# Patient Record
Sex: Female | Born: 1975 | Race: White | Hispanic: No | Marital: Married | Smoking: Never smoker
Health system: Southern US, Community
[De-identification: ages and names within clinical notes are randomized; demographics above are authoritative.]

## PROBLEM LIST (undated history)

## (undated) DIAGNOSIS — Z789 Other specified health status: Secondary | ICD-10-CM

## (undated) DIAGNOSIS — C801 Malignant (primary) neoplasm, unspecified: Secondary | ICD-10-CM

## (undated) DIAGNOSIS — R87629 Unspecified abnormal cytological findings in specimens from vagina: Secondary | ICD-10-CM

## (undated) HISTORY — PX: WISDOM TOOTH EXTRACTION: SHX21

---

## 2010-01-13 HISTORY — PX: LEEP: SHX91

## 2010-02-27 NOTE — L&D Delivery Note (Addendum)
Delivery Note  Stand-by for delivery for Dr. Juliene Pina, patient pushing effectively with minimal coaching. At 12:35 PM a viable female was delivered via Vaginal, Spontaneous Delivery (Presentation: Left Occiput Anterior).  APGAR: 9, 9; weight 6 lb 0.7 oz (2741 g).   Placenta status: Intact, Spontaneous, to patho - marginal insertion, PTB. Cord: 3 vessels with the following complications: none.  Cord pH: n/a  Anesthesia: Epidural  Episiotomy: None Lacerations: 2nd degree;Perineal (by Dr. Juliene Pina) Suture Repair: 3.0 vicryl rapide Est. Blood Loss (mL): 400cc  Mom to postpartum.  Baby  with mother.  Amber Terrell,Amber Terrell 10/21/2010, 1:25 PM  MD note- Arrived after SVD and placenta delivery which were both uncomplicated. 2nd degree perineal lac repaired. Pt baby stable.

## 2010-04-07 LAB — RUBELLA ANTIBODY, IGM: Rubella: IMMUNE

## 2010-04-07 LAB — HEPATITIS B SURFACE ANTIGEN: Hepatitis B Surface Ag: NEGATIVE

## 2010-04-07 LAB — RPR: RPR: NONREACTIVE

## 2010-04-07 LAB — GC/CHLAMYDIA PROBE AMP, GENITAL: Chlamydia: NEGATIVE

## 2010-04-07 LAB — ANTIBODY SCREEN: Antibody Screen: NEGATIVE

## 2010-10-20 ENCOUNTER — Inpatient Hospital Stay (HOSPITAL_COMMUNITY)
Admission: AD | Admit: 2010-10-20 | Discharge: 2010-10-23 | DRG: 373 | Disposition: A | Discharge status: Home / Self Care | Payer: BC Managed Care – PPO | Source: Ambulatory Visit | Attending: Obstetrics & Gynecology | Admitting: Obstetrics & Gynecology

## 2010-10-20 ENCOUNTER — Encounter (HOSPITAL_COMMUNITY): Payer: Self-pay | Admitting: Obstetrics and Gynecology

## 2010-10-20 DIAGNOSIS — IMO0001 Reserved for inherently not codable concepts without codable children: Secondary | ICD-10-CM

## 2010-10-20 HISTORY — DX: Other specified health status: Z78.9

## 2010-10-20 NOTE — Progress Notes (Signed)
"  Uc's since 1700.  Now every 2-5 mins."

## 2010-10-21 ENCOUNTER — Encounter (HOSPITAL_COMMUNITY): Payer: Self-pay | Admitting: *Deleted

## 2010-10-21 ENCOUNTER — Other Ambulatory Visit: Payer: Self-pay | Admitting: Obstetrics & Gynecology

## 2010-10-21 ENCOUNTER — Inpatient Hospital Stay (HOSPITAL_COMMUNITY): Payer: BC Managed Care – PPO | Admitting: Anesthesiology

## 2010-10-21 ENCOUNTER — Encounter (HOSPITAL_COMMUNITY): Payer: Self-pay | Admitting: Anesthesiology

## 2010-10-21 DIAGNOSIS — IMO0001 Reserved for inherently not codable concepts without codable children: Secondary | ICD-10-CM

## 2010-10-21 LAB — RPR: RPR Ser Ql: NONREACTIVE

## 2010-10-21 LAB — CBC
HCT: 38.1 % (ref 36.0–46.0)
MCH: 32.3 pg (ref 26.0–34.0)
MCHC: 33.9 g/dL (ref 30.0–36.0)
RDW: 13 % (ref 11.5–15.5)

## 2010-10-21 LAB — ABO/RH: ABO/RH(D): A NEG

## 2010-10-21 MED ORDER — LACTATED RINGERS IV SOLN: 500.0000 mL | INTRAVENOUS | Status: DC | PRN

## 2010-10-21 MED ORDER — IBUPROFEN 600 MG PO TABS: 600.0000 mg | ORAL_TABLET | Freq: Four times a day (QID) | ORAL | Status: DC | PRN

## 2010-10-21 MED ORDER — PHENYLEPHRINE 40 MCG/ML (10ML) SYRINGE FOR IV PUSH (FOR BLOOD PRESSURE SUPPORT)
80.0000 ug | PREFILLED_SYRINGE | INTRAVENOUS | Status: DC | PRN
  Filled 2010-10-21 (×2): qty 5

## 2010-10-21 MED ORDER — BUTORPHANOL TARTRATE 2 MG/ML IJ SOLN
2.0000 mg | Freq: Once | INTRAMUSCULAR | Status: AC
  Administered 2010-10-21: 2 mg via INTRAMUSCULAR
  Filled 2010-10-21: qty 1

## 2010-10-21 MED ORDER — LIDOCAINE HCL (PF) 1 % IJ SOLN
30.0000 mL | INTRAMUSCULAR | Status: DC | PRN
  Filled 2010-10-21 (×2): qty 30

## 2010-10-21 MED ORDER — OXYCODONE-ACETAMINOPHEN 5-325 MG PO TABS
1.0000 | ORAL_TABLET | ORAL | Status: DC | PRN
  Administered 2010-10-22: 1 via ORAL
  Filled 2010-10-21: qty 1

## 2010-10-21 MED ORDER — LACTATED RINGERS IV SOLN
INTRAVENOUS | Status: DC
  Administered 2010-10-21: 125 mL/h via INTRAVENOUS

## 2010-10-21 MED ORDER — ACETAMINOPHEN 325 MG PO TABS
650.0000 mg | ORAL_TABLET | Freq: Once | ORAL | Status: AC
  Administered 2010-10-21: 650 mg via ORAL
  Filled 2010-10-21: qty 2

## 2010-10-21 MED ORDER — OXYTOCIN 20 UNITS IN LACTATED RINGERS INFUSION - SIMPLE: 125.0000 mL/h | INTRAVENOUS | Status: DC

## 2010-10-21 MED ORDER — LANOLIN HYDROUS EX OINT: TOPICAL_OINTMENT | CUTANEOUS | Status: DC | PRN

## 2010-10-21 MED ORDER — TETANUS-DIPHTH-ACELL PERTUSSIS 5-2.5-18.5 LF-MCG/0.5 IM SUSP
0.5000 mL | Freq: Once | INTRAMUSCULAR | Status: DC
  Filled 2010-10-21: qty 0.5

## 2010-10-21 MED ORDER — DIPHENHYDRAMINE HCL 25 MG PO CAPS: 25.0000 mg | ORAL_CAPSULE | Freq: Four times a day (QID) | ORAL | Status: DC | PRN

## 2010-10-21 MED ORDER — ONDANSETRON HCL 4 MG PO TABS: 4.0000 mg | ORAL_TABLET | ORAL | Status: DC | PRN

## 2010-10-21 MED ORDER — ACETAMINOPHEN 325 MG PO TABS: 650.0000 mg | ORAL_TABLET | ORAL | Status: DC | PRN

## 2010-10-21 MED ORDER — FLEET ENEMA 7-19 GM/118ML RE ENEM: 1.0000 | ENEMA | RECTAL | Status: DC | PRN

## 2010-10-21 MED ORDER — IBUPROFEN 600 MG PO TABS
600.0000 mg | ORAL_TABLET | Freq: Four times a day (QID) | ORAL | Status: DC
  Administered 2010-10-21 – 2010-10-23 (×8): 600 mg via ORAL
  Filled 2010-10-21 (×8): qty 1

## 2010-10-21 MED ORDER — LACTATED RINGERS IV SOLN
500.0000 mL | Freq: Once | INTRAVENOUS | Status: AC
  Administered 2010-10-21: 500 mL via INTRAVENOUS

## 2010-10-21 MED ORDER — PHENYLEPHRINE 40 MCG/ML (10ML) SYRINGE FOR IV PUSH (FOR BLOOD PRESSURE SUPPORT)
80.0000 ug | PREFILLED_SYRINGE | INTRAVENOUS | Status: DC | PRN
  Filled 2010-10-21: qty 5

## 2010-10-21 MED ORDER — OXYTOCIN BOLUS FROM INFUSION
500.0000 mL | Freq: Once | INTRAVENOUS | Status: AC
  Administered 2010-10-21: 500 mL via INTRAVENOUS
  Filled 2010-10-21: qty 1000
  Filled 2010-10-21: qty 500

## 2010-10-21 MED ORDER — ZOLPIDEM TARTRATE 5 MG PO TABS: 5.0000 mg | ORAL_TABLET | Freq: Every evening | ORAL | Status: DC | PRN

## 2010-10-21 MED ORDER — WITCH HAZEL-GLYCERIN EX PADS: 1.0000 "application " | MEDICATED_PAD | CUTANEOUS | Status: DC | PRN

## 2010-10-21 MED ORDER — PRENATAL PLUS 27-1 MG PO TABS
1.0000 | ORAL_TABLET | Freq: Every day | ORAL | Status: DC
  Administered 2010-10-21 – 2010-10-23 (×3): 1 via ORAL
  Filled 2010-10-21 (×3): qty 1

## 2010-10-21 MED ORDER — EPHEDRINE 5 MG/ML INJ
10.0000 mg | INTRAVENOUS | Status: DC | PRN
  Filled 2010-10-21 (×2): qty 4

## 2010-10-21 MED ORDER — DOCUSATE SODIUM 100 MG PO CAPS
100.0000 mg | ORAL_CAPSULE | Freq: Two times a day (BID) | ORAL | Status: DC
  Administered 2010-10-22 – 2010-10-23 (×3): 100 mg via ORAL
  Filled 2010-10-21 (×2): qty 1

## 2010-10-21 MED ORDER — FENTANYL 2.5 MCG/ML BUPIVACAINE 1/10 % EPIDURAL INFUSION (WH - ANES)
14.0000 mL/h | INTRAMUSCULAR | Status: DC
Start: 2010-10-21 — End: 2010-10-21
  Administered 2010-10-21 (×2): 14 mL/h via EPIDURAL
  Filled 2010-10-21 (×2): qty 60

## 2010-10-21 MED ORDER — BENZOCAINE-MENTHOL 20-0.5 % EX AERO
INHALATION_SPRAY | CUTANEOUS | Status: AC
  Filled 2010-10-21: qty 56

## 2010-10-21 MED ORDER — DIBUCAINE 1 % RE OINT: 1.0000 "application " | TOPICAL_OINTMENT | RECTAL | Status: DC | PRN

## 2010-10-21 MED ORDER — LIDOCAINE HCL 1.5 % IJ SOLN
INTRAMUSCULAR | Status: DC | PRN
  Administered 2010-10-21 (×2): 5 mL via EPIDURAL

## 2010-10-21 MED ORDER — SENNOSIDES-DOCUSATE SODIUM 8.6-50 MG PO TABS
2.0000 | ORAL_TABLET | Freq: Every day | ORAL | Status: DC
  Administered 2010-10-21 – 2010-10-22 (×2): 2 via ORAL

## 2010-10-21 MED ORDER — BENZOCAINE-MENTHOL 20-0.5 % EX AERO: 1.0000 "application " | INHALATION_SPRAY | CUTANEOUS | Status: DC | PRN

## 2010-10-21 MED ORDER — VITAMIN D 1000 UNITS PO TABS
1000.0000 [IU] | ORAL_TABLET | Freq: Every day | ORAL | Status: DC
  Administered 2010-10-22 – 2010-10-23 (×2): 1000 [IU] via ORAL
  Filled 2010-10-21 (×4): qty 1

## 2010-10-21 MED ORDER — EPHEDRINE 5 MG/ML INJ
10.0000 mg | INTRAVENOUS | Status: DC | PRN
  Filled 2010-10-21: qty 4

## 2010-10-21 MED ORDER — ONDANSETRON HCL 4 MG/2ML IJ SOLN: 4.0000 mg | INTRAMUSCULAR | Status: DC | PRN

## 2010-10-21 MED ORDER — CLINDAMYCIN PHOSPHATE 900 MG/50ML IV SOLN
900.0000 mg | Freq: Three times a day (TID) | INTRAVENOUS | Status: DC
  Administered 2010-10-21: 900 mg via INTRAVENOUS
  Filled 2010-10-21 (×2): qty 50

## 2010-10-21 MED ORDER — BISACODYL 10 MG RE SUPP: 10.0000 mg | Freq: Every day | RECTAL | Status: DC | PRN

## 2010-10-21 MED ORDER — ONDANSETRON HCL 4 MG/2ML IJ SOLN: 4.0000 mg | Freq: Four times a day (QID) | INTRAMUSCULAR | Status: DC | PRN

## 2010-10-21 MED ORDER — CITRIC ACID-SODIUM CITRATE 334-500 MG/5ML PO SOLN: 30.0000 mL | ORAL | Status: DC | PRN

## 2010-10-21 MED ORDER — OXYCODONE-ACETAMINOPHEN 5-325 MG PO TABS: 2.0000 | ORAL_TABLET | ORAL | Status: DC | PRN

## 2010-10-21 MED ORDER — DIPHENHYDRAMINE HCL 50 MG/ML IJ SOLN: 12.5000 mg | INTRAMUSCULAR | Status: DC | PRN

## 2010-10-21 MED ORDER — SIMETHICONE 80 MG PO CHEW: 80.0000 mg | CHEWABLE_TABLET | ORAL | Status: DC | PRN

## 2010-10-21 NOTE — Progress Notes (Signed)
S: Here at MD request AROM. Doing well, pain controlled Epidural in place.   O: BP 116/70  Pulse 78  Temp(Src) 97.2 F (36.2 C) (Oral)  Resp 18  Ht 5\' 6"  (1.676 m)  Wt 179 lb 3.2 oz (81.285 kg)  BMI 28.92 kg/m2  SpO2 100%  LMP 02/05/2010  GBS neg per office report this AM  FHT:  FHR: 140 bpm, variability: moderate,  accelerations:  Present,  decelerations:  Absent UC:   regular, every 2-3 minutes SVE:   Dilation: 6 Effacement (%): 90 Station: -1 Exam by:: Elvera Almario,CNM  AROM w/ cl AF FHR reassuring post AROM   A / P: Spontaneous labor, progressing normally,  AROM augmentation, Pitocin PRN GBS neg - D/C IV abx. Fetal Wellbeing:  Category I Pain Control:  Epidural  Anticipated MOD:  NSVD  Amber Terrell 10/21/2010, 10:59 AM

## 2010-10-21 NOTE — Progress Notes (Signed)
Pt may go to room 174.

## 2010-10-21 NOTE — Progress Notes (Signed)
Wiliam Ke, CNM at bedside. US done.  Vertex presentation.

## 2010-10-21 NOTE — ED Provider Notes (Signed)
History     Chief Complaint  Patient presents with  . Contractions   HPI  G1P0 at 36.4/7 wks, early labor, contractions since this evening, getting stronger. Good FMs. No vag blding except brown discharge.  Uncomplicated Ob course, PNC with Dr Juliene Pina at [facility]. GBS negative.  Abn Pap, LEEP less than 1 yr back.    Past Medical History  Diagnosis Date  . No pertinent past medical history     Past Surgical History  Procedure Date  . Wisdom tooth extraction     No family history on file.  History  Substance Use Topics  . Smoking status: Never Smoker   . Smokeless tobacco: Not on file  . Alcohol Use: No    Allergies:  Allergies  Allergen Reactions  . Penicillins Rash    Prescriptions prior to admission  Medication Sig Dispense Refill  . cholecalciferol (VITAMIN D) 1000 UNITS tablet Take 1,000 Units by mouth daily.        Marland Kitchen docusate sodium (COLACE) 100 MG capsule Take 100 mg by mouth 2 (two) times daily.        . folic acid (FOLVITE) 400 MCG tablet Take 400 mcg by mouth daily.        . Multiple Vitamin (MULTIVITAMIN PO) Take 1 tablet by mouth.          ROS Neg CP/SOB/HA/RUQ pain/ LE pain swelling.   Physical Exam   Blood pressure 123/79, pulse 74, temperature 98.4 F (36.9 C), temperature source Oral, resp. rate 18, height 5\' 6"  (1.676 m), weight 81.285 kg (179 lb 3.2 oz), last menstrual period 02/05/2010.  Physical Exam Physical exam:  A&O x 3, no acute distress. Pleasant HEENT neg, no thyromegaly Lungs CTA bilat CV RRR, A1S2 normal Abdo soft, non tender, non acute Extr no edema/ tenderness Pelvic Cx 3 cm/90%/Vtx./ intact membranes (changed from 2 cm when first arrived at MAU and was 1 cm in office yesterday) FHT  140s/ + accels/ no decels/ moderate variability, Category I.  Toco q 3-4 minutes.   MAU Course  Procedures In MAU 2 + hrs with 1 cm cervical change. Very uncomfortable pt with contractions and back pain. Option to go home and return if  more stronger UCs, pt desires pain management now if possible.   Assessment and Plan  Plan Admit to L&D for observation since MAU is full.  Stadol 2 mg IM. Reassess in 2 hrs and sooner if more active.  Will not augment labor since 36+ wks.   Chontel Warning R 10/21/2010, 2:48 AM

## 2010-10-21 NOTE — Progress Notes (Signed)
Dr. Juliene Pina at bedside.  Assessment done and poc discussed with pt.  VE done.

## 2010-10-21 NOTE — Progress Notes (Signed)
S/O: Comfortable with epidural, feels mild cramping w/ some ctx, denies rectal/pelvic pressure.  VSS, afebrile FHR 140 and reassuring, no decel's, (+) accels and mod variability Ctx Q 2-3 min, strong Foley cath to gravity  cvx 9.5, small ant lip, +1, OA  A/P: IUP late preterm w/ spont labor FWB reassuring Pain controlled w/ epidural Rapid progress Passive descent Ant. NSVD Dr. Juliene Pina to follow if available, CNM stand-by  Arlan Organ, CNM 10/21/2010 11:23 AM

## 2010-10-21 NOTE — Progress Notes (Signed)
Wiliam Ke, CNM notified of VE.  Dr. Juliene Pina will be up to see pt after surgery.

## 2010-10-21 NOTE — Progress Notes (Signed)
Dr. Juliene Pina notified of fhr tracing, uc pattern, and sve.  Orders received to admit the patient to birthing suites, start an IV, pt may have an epidural.

## 2010-10-21 NOTE — H&P (Signed)
Amber Terrell is a 35 y.o. female presenting for labor.  Made active cervical change in MAU, observed with Stadol, now in active labor. No ROM, good FMs.  PNCare at [facility] since 8-9 wks Uncomplicated Ob course. CIN III, LEEP hx. Normal Pap x 1 since then.   OB History    Grav Para Term Preterm Abortions TAB SAB Ect Mult Living   1              Past Medical History  Diagnosis Date  . No pertinent past medical history    Past Surgical History  Procedure Date  . Wisdom tooth extraction   . Leep 01/13/2010   Family History: family history is not on file. Social History:  reports that she has never smoked. She does not have any smokeless tobacco history on file. She reports that she does not drink alcohol or use illicit drugs.  Physical Exam:   Dilation: 5 Effacement (%): 80 Station: -1 Exam by:: C.Jones, RN  Blood pressure 104/62, pulse 87, temperature 98.5 F (36.9 C), temperature source Oral, resp. rate 18, height 5\' 6"  (1.676 m), weight 81.285 kg (179 lb 3.2 oz), last menstrual period 02/05/2010, SpO2 100.00%.  A&O x 3, no acute distress. Pleasant HEENT neg, no thyromegaly Lungs CTA bilat CV RRR, A1S2 normal Abdo soft, non tender, non acute Extr no edema/ tenderness Pelvic 3 cm at 3 am and 5 cm at 6 am. Vtx, intact, bloody show FHT  140s/ + accels/ no decels/ mod variab- category I Toco q 3 min  Prenatal labs: ABO, Rh: A/Negative/-- (02/09 0000), s/p Rhogam Antibody: Negative (02/09 0000) Rubella:  Immune RPR: Nonreactive (02/09 0000)  HBsAg: Negative (02/09 0000)  HIV: Non-reactive (02/09 0000)  GBS:   Unknown (done 10/19/10) 1 hr Glucola   normal Genetic screening  Declined Anatomy US Nl anatomy, marginal cord insertion, normal fetal growth on interval sono.    Assessment/Plan: Expectant mngmt, Clindamycin for GBS coverage Will call lab to get GBS report since sone on 10/19/10  Thomes Burak R 10/21/2010, 8:22 AM

## 2010-10-21 NOTE — Progress Notes (Signed)
Pt presents to mau for ctx that started around 5pm last night. Worsened through the night. Pt has been 1cm since July.

## 2010-10-21 NOTE — Anesthesia Preprocedure Evaluation (Signed)
Anesthesia Evaluation  Name, MR# and DOB Patient awake  General Assessment Comment  Reviewed: Allergy & Precautions, H&P , Patient's Chart, lab work & pertinent test results  Airway Mallampati: II TM Distance: >3 FB Neck ROM: full    Dental No notable dental hx.    Pulmonary  clear to auscultation  pulmonary exam normalPulmonary Exam Normal breath sounds clear to auscultation none    Cardiovascular regular Normal    Neuro/Psych Negative Neurological ROS  Negative Psych ROS  GI/Hepatic/Renal negative GI ROS  negative Liver ROS  negative Renal ROS        Endo/Other  Negative Endocrine ROS (+)      Abdominal   Musculoskeletal   Hematology negative hematology ROS (+)   Peds  Reproductive/Obstetrics (+) Pregnancy    Anesthesia Other Findings             Anesthesia Physical Anesthesia Plan  ASA: II  Anesthesia Plan: Epidural   Post-op Pain Management:    Induction:   Airway Management Planned:   Additional Equipment:   Intra-op Plan:   Post-operative Plan:   Informed Consent: I have reviewed the patients History and Physical, chart, labs and discussed the procedure including the risks, benefits and alternatives for the proposed anesthesia with the patient or authorized representative who has indicated his/her understanding and acceptance.     Plan Discussed with:   Anesthesia Plan Comments:         Anesthesia Quick Evaluation  

## 2010-10-21 NOTE — Anesthesia Procedure Notes (Signed)
Epidural Patient location during procedure: OB Start time: 10/21/2010 7:24 AM  Staffing Performed by: anesthesiologist   Preanesthetic Checklist Completed: patient identified, site marked, surgical consent, pre-op evaluation, timeout performed, IV checked, risks and benefits discussed and monitors and equipment checked  Epidural Patient position: sitting Prep: site prepped and draped and DuraPrep Patient monitoring: continuous pulse ox and blood pressure Approach: midline Injection technique: LOR air and LOR saline  Needle:  Needle type: Tuohy  Needle gauge: 17 G Needle length: 9 cm Needle insertion depth: 5 cm cm Catheter type: closed end flexible Catheter size: 19 Gauge Catheter at skin depth: 10 cm Test dose: negative  Assessment Events: blood not aspirated, injection not painful, no injection resistance, negative IV test and no paresthesia  Additional Notes Patient identified.  Risk benefits discussed including failed block, incomplete pain control, headache, nerve damage, paralysis, blood pressure changes, nausea, vomiting, reactions to medication both toxic or allergic, and postpartum back pain.  Patient expressed understanding and wished to proceed.  All questions were answered.  Sterile technique used throughout procedure and epidural site dressed with sterile barrier dressing. No paresthesia or other complications noted.The patient did not experience any signs of intravascular injection such as tinnitus or metallic taste in mouth nor signs of intrathecal spread such as rapid motor block. Please see nursing notes for vital signs.

## 2010-10-22 ENCOUNTER — Encounter (HOSPITAL_COMMUNITY): Payer: Self-pay | Admitting: Certified Nurse Midwife

## 2010-10-22 LAB — CBC
Hemoglobin: 10.3 g/dL — ABNORMAL LOW (ref 12.0–15.0)
Platelets: 125 10*3/uL — ABNORMAL LOW (ref 150–400)
RBC: 3.15 MIL/uL — ABNORMAL LOW (ref 3.87–5.11)
WBC: 13.6 10*3/uL — ABNORMAL HIGH (ref 4.0–10.5)

## 2010-10-22 MED ORDER — RHO D IMMUNE GLOBULIN 1500 UNIT/2ML IJ SOLN
300.0000 ug | Freq: Once | INTRAMUSCULAR | Status: AC
  Administered 2010-10-22: 300 ug via INTRAMUSCULAR
  Filled 2010-10-22: qty 2

## 2010-10-22 NOTE — Anesthesia Postprocedure Evaluation (Signed)
  Anesthesia Post-op Note  Patient: Amber Terrell  Procedure(s) Performed: * No procedures listed *  Patient Location: PACU and Mother/Baby  Anesthesia Type: Epidural  Level of Consciousness: awake, alert  and oriented  Airway and Oxygen Therapy: Patient Spontanous Breathing  Post-op Pain: mild  Post-op Assessment: Patient's Cardiovascular Status Stable, Respiratory Function Stable, Patent Airway, No signs of Nausea or vomiting, Adequate PO intake and Pain level controlled  Post-op Vital Signs: stable  Complications: No apparent anesthesia complications

## 2010-10-22 NOTE — Progress Notes (Signed)
  PPD 1 SVD  S:  Reports feeling well - tired with some soreness in bottom             Tolerating po/ No nausea or vomiting             Bleeding is moderate             Pain controlled withprescription NSAID's including motrin and narcotic analgesics including percocet             Up ad lib / ambulatory  Newborn breast feeding  / Circumcision declined   O:  A & O x 3 NAD - resting in bed             VS: Blood pressure 98/63, pulse 68, temperature 98.3 F (36.8 C), temperature source Oral, resp. rate 18, height 5\' 6"  (1.676 m), weight 81.285 kg (179 lb 3.2 oz), last menstrual period 02/05/2010, SpO2 97.00%, unknown if currently breastfeeding.  LABS:  Basename 10/22/10 0513 10/21/10 0325  HGB 10.3* 12.9  HCT 30.3* 38.1    Lungs: Clear and unlabored  Heart: regular rate and rhythm / no mumurs  Abdomen: soft, non-tender, non-distended, active bowel sounds             Fundus: firm, non-tender, U-1  Perineum: mild edema - ice pack in place  Lochia: moderate  Extremities: trace edema, no calf pain or tenderness, negative Homans    A/P: PPD # 1   Doing well - stable status  Routine post partum orders    Mickey Hebel 10/22/2010, 7:13 AM

## 2010-10-23 LAB — RH IG WORKUP (INCLUDES ABO/RH)
ABO/RH(D): A NEG
Antibody Screen: POSITIVE
Fetal Screen: NEGATIVE
Gestational Age(Wks): 36.5

## 2010-10-23 MED ORDER — IBUPROFEN 600 MG PO TABS: 600.0000 mg | ORAL_TABLET | Freq: Four times a day (QID) | ORAL | Status: AC

## 2010-10-23 NOTE — Discharge Summary (Signed)
Obstetric Discharge Summary Reason for Admission: onset of labor Prenatal Procedures: none Intrapartum Procedures: spontaneous vaginal delivery Postpartum Procedures: none Complications-Operative and Postpartum: none and 2 degree perineal laceration Hemoglobin  Date Value Range Status  10/22/2010 10.3* 12.0-15.0 (g/dL) Final     DELTA CHECK NOTED     REPEATED TO VERIFY     HCT  Date Value Range Status  10/22/2010 30.3* 36.0-46.0 (%) Final   Rhogam given - Mom Rh negative / Newborn positive Discharge Diagnoses: Term Pregnancy-delivered  Discharge Information: Date: 10/23/2010 Activity: pelvic rest Diet: routine Medications: PNV, Ibuprophen and Colace Condition: stable Instructions: refer to practice specific booklet Discharge to: home Follow-up Information    Follow up with MODY,VAISHALI R. Make an appointment in 6 weeks.   Contact information:   [address] [place] [address] [phone]          Newborn Data: Live born female  Birth Weight: 6 lb 0.7 oz (2741 g) APGAR: 9, 9  Home with mother.  BAILEY,TANYA 10/23/2010, 10:17 AM

## 2010-10-23 NOTE — Progress Notes (Signed)
  PPD 2 SVD  S:  Reports feeling well             Tolerating po/ No nausea or vomiting             Bleeding is light             Pain controlled withprescription NSAID's including motrin only             Up ad lib / ambulatory  Newborn breastfeeding    O:  A & O x 3 NAD             VS: Blood pressure 99/70, pulse 72, temperature 98.3 F (36.8 C), temperature source Oral, resp. rate 18, height 5\' 6"  (1.676 m), weight 81.285 kg (179 lb 3.2 oz), last menstrual period 02/05/2010, SpO2 97.00%, unknown if currently breastfeeding.  LABS:  Basename 10/22/10 0513 10/21/10 0325  HGB 10.3* 12.9  HCT 30.3* 38.1    I&O:        Lungs: Clear and unlabored  Heart: regular rate and rhythm / no mumurs  Abdomen: soft, non-tender, non-distended              Fundus: firm, non-tender, U-2  Perineum: no edema  Lochia: light  Extremities: no edema, no calf pain or tenderness, negative Homans    A/P: PPD # 2   Doing well - stable status  Discharge Home  Jacinto Keil 10/23/2010, 10:14 AM

## 2010-10-24 ENCOUNTER — Encounter (HOSPITAL_COMMUNITY): Payer: Self-pay | Admitting: *Deleted

## 2010-12-21 ENCOUNTER — Ambulatory Visit (HOSPITAL_COMMUNITY)
Admission: RE | Admit: 2010-12-21 | Discharge: 2010-12-21 | Disposition: A | Discharge status: Home / Self Care | Payer: BC Managed Care – PPO | Source: Ambulatory Visit | Attending: Obstetrics & Gynecology | Admitting: Obstetrics & Gynecology

## 2010-12-21 NOTE — Progress Notes (Signed)
Adult Lactation Consultation Outpatient Visit Note  Patient Name: Amber Terrell Date of Birth: 05-08-1975 Gestational Age at Delivery: Unknown Type of Delivery:                       Infant 2 months old  REASON FOR TODAY VISIT: per mom infant has seemed to narrow his sucking pattern and my nipples are sore ( both nipples . Per mom still latching with a nipple shield #20 both sides . Infant had a Pediactric visit this am and received shots ( alittle fussy ) . ( WEIGHT at DR. Office = 10-10oz. )  Breastfeeding History: Frequency of Breastfeeding: Majority time "OWEN" will only feed on one breast and sometimes I just pump down the 2nd breast  Length of Feeding: every 2-3 hours.  Voids:>6   Stools: 1-2 light yellow ,sometimes green .   Supplementing / Method: Pumping:  Type of Pump:DEBP- Medela   Frequency:PRN   Volume:    Comments:    Consultation Evaluation:          Mom mentioned she had tried weaning "OWEN " off the nipple shield ,but she had not been successful also Cornelius Moras didn't seem to feed as well . Asessment of nipples prior to feed ( pinky red ) ,per mom they always seem to be like that . ( Mom is fair skin color ,red head ,also reports having sensitive skin .   Initial Feeding Assessment: Pre-feed Weight:   10.11.6oz (4864g)  Post-feed Weight:10.14.5 oz ( 4948g)  Amount Transferred:41ml  Comments: Infant latched well with a #20 nipple shield ( mom independent with applying NS , ( LC did see a small fold in the aerolo ,? Causing the pinching with latching mom had mentioned ,LC ease back skin prior to infant latching . Initially with latch mom seemed to be uncomfortable assisted with breast compression to mold tissue into infants month and the discomfort eased up for mom . Infant fed 15-20 mins in a consistent pattern with multiply swallows and gulps ( towards the end of the feeding noted infant pulling back and not maintaining depth ,squirmy and grunting ( ?gas or trying to have a  stool) . ( per mom he does that a lot ) .   Additional Feeding Assessment: Pre-feed Weight: 10.14.5 ( 4948g) Post-feed Weight:10.15.6oz ( 5784O) Amount Transferred:59ml Comments: right breast mom latched with nipple shield at 1st , then we unlatched and relatched without the nipple shield, at 1st mom having some mild discomfort and worked on depth at the breast ,also easing infants mouth open , mom more comfortable ,reports occasional pinching ( infant does have a small month eventhough he is 2months old at 10 + pounds . After feeding assessed infants tongue for signs of thrush did not observe a white coating ( although noted 3 small white bumps right lateral side -per mom they are cysts and have been present since birth ) , not diaper rash noted ( Mom did report there was a red rash a few days ago and she used her diaper cream and it went away . )   Additional Feeding Assessment: Pre-feed Weight: Post-feed Weight: Amount Transferred: Comments:  Total Breast milk Transferred this Visit: Total Supplement Given: none  Additional Interventions: Per mom will be returning back to work in a month or so . Has already introduced a bottle ( Tommy Tippy ) 2-3 X's per week . ( LC recommended making sure infant keeping his lips flanged open  at when using bottle . Also working on latching without the nipple shield .Marland Kitchen Due to soreness ,(pinky red nipples ? Yeast ,but mom only reports some burning at the end of the feeding . AT consult Yeast information given. Also since mom already has a prescription for the Nipple cream for (anti-yeast tx) , recommended still using it to see if it helped to clear up sore ness , also to hand express small am't of the fullness off before latching to obtain a deeper latch . Also make sure she was eating enough for Breastfeeding ( Nutritious snacks and meals ) . -   Follow-Up Encouraged mom to call the Lactation services if the soreness didn't improve with the nipple cream ,  hand expressing off some of the fullness prior to latch .       Kathrin Greathouse 12/21/2010, 3:27 PM

## 2012-08-28 LAB — OB RESULTS CONSOLE ANTIBODY SCREEN: Antibody Screen: NEGATIVE

## 2012-08-28 LAB — OB RESULTS CONSOLE HIV ANTIBODY (ROUTINE TESTING): HIV: NONREACTIVE

## 2012-08-28 LAB — OB RESULTS CONSOLE HEPATITIS B SURFACE ANTIGEN: Hepatitis B Surface Ag: NEGATIVE

## 2012-08-28 LAB — OB RESULTS CONSOLE ABO/RH: RH TYPE: NEGATIVE

## 2012-08-28 LAB — OB RESULTS CONSOLE RPR: RPR: NONREACTIVE

## 2012-08-28 LAB — OB RESULTS CONSOLE RUBELLA ANTIBODY, IGM: Rubella: IMMUNE

## 2012-09-09 LAB — OB RESULTS CONSOLE GC/CHLAMYDIA
CHLAMYDIA, DNA PROBE: NEGATIVE
GC PROBE AMP, GENITAL: NEGATIVE

## 2013-02-27 NOTE — L&D Delivery Note (Signed)
Delivery Note At 4:41 PM a viable and healthy female was delivered via Vaginal, Spontaneous Delivery (Presentation: Left Occiput Anterior).  APGAR: 9, 9; weight-pending .   Placenta status: Intact, Spontaneous.  Cord: 3 vessels with the following complications: None.  Cord pH: N/A Anesthesia: Epidural  Episiotomy: None Lacerations: 2nd degree Suture Repair: 3.0 vicryl rapide Est. Blood Loss (mL): 300 cc  Mom to postpartum.  Baby to Couplet care / Skin to Skin.  Gardiner Espana R 04/06/2013, 5:16 PM

## 2013-03-11 LAB — OB RESULTS CONSOLE GBS: GBS: NEGATIVE

## 2013-04-04 ENCOUNTER — Other Ambulatory Visit: Payer: Self-pay | Admitting: Obstetrics & Gynecology

## 2013-04-04 ENCOUNTER — Telehealth (HOSPITAL_COMMUNITY): Payer: Self-pay | Admitting: *Deleted

## 2013-04-04 ENCOUNTER — Encounter (HOSPITAL_COMMUNITY): Payer: Self-pay | Admitting: *Deleted

## 2013-04-04 NOTE — Telephone Encounter (Signed)
Preadmission screen  

## 2013-04-05 ENCOUNTER — Inpatient Hospital Stay (HOSPITAL_COMMUNITY)
Admission: RE | Admit: 2013-04-05 | Discharge: 2013-04-05 | Disposition: A | Discharge status: Home / Self Care | Payer: BC Managed Care – PPO | Source: Ambulatory Visit | Attending: Obstetrics & Gynecology | Admitting: Obstetrics & Gynecology

## 2013-04-05 NOTE — Progress Notes (Signed)
Spoke with Dr. Benjie Karvonen, advised that patient only 38 6/7 weeks. Instructed to call patient and have patient come in the am. Called patient. Reports good FM. Advised would need to come in the am for IOL. LD precautions given. Pt verbalizes understanding.

## 2013-04-06 ENCOUNTER — Encounter (HOSPITAL_COMMUNITY): Payer: BC Managed Care – PPO | Admitting: Anesthesiology

## 2013-04-06 ENCOUNTER — Inpatient Hospital Stay (HOSPITAL_COMMUNITY)
Admission: RE | Admit: 2013-04-06 | Discharge: 2013-04-08 | DRG: 774 | Disposition: A | Discharge status: Home / Self Care | Payer: BC Managed Care – PPO | Source: Ambulatory Visit | Attending: Obstetrics & Gynecology | Admitting: Obstetrics & Gynecology

## 2013-04-06 ENCOUNTER — Encounter (HOSPITAL_COMMUNITY): Payer: Self-pay

## 2013-04-06 ENCOUNTER — Inpatient Hospital Stay (HOSPITAL_COMMUNITY): Payer: BC Managed Care – PPO | Admitting: Anesthesiology

## 2013-04-06 DIAGNOSIS — O09529 Supervision of elderly multigravida, unspecified trimester: Secondary | ICD-10-CM | POA: Diagnosis present

## 2013-04-06 DIAGNOSIS — O909 Complication of the puerperium, unspecified: Secondary | ICD-10-CM | POA: Diagnosis present

## 2013-04-06 DIAGNOSIS — O360990 Maternal care for other rhesus isoimmunization, unspecified trimester, not applicable or unspecified: Secondary | ICD-10-CM | POA: Diagnosis present

## 2013-04-06 HISTORY — DX: Malignant (primary) neoplasm, unspecified: C80.1

## 2013-04-06 HISTORY — DX: Unspecified abnormal cytological findings in specimens from vagina: R87.629

## 2013-04-06 LAB — CBC
HCT: 36.7 % (ref 36.0–46.0)
HEMOGLOBIN: 12.5 g/dL (ref 12.0–15.0)
MCH: 32.3 pg (ref 26.0–34.0)
MCHC: 34.1 g/dL (ref 30.0–36.0)
MCV: 94.8 fL (ref 78.0–100.0)
Platelets: 129 10*3/uL — ABNORMAL LOW (ref 150–400)
RBC: 3.87 MIL/uL (ref 3.87–5.11)
RDW: 13.2 % (ref 11.5–15.5)
WBC: 9.4 10*3/uL (ref 4.0–10.5)

## 2013-04-06 LAB — RPR: RPR Ser Ql: NONREACTIVE

## 2013-04-06 MED ORDER — TERBUTALINE SULFATE 1 MG/ML IJ SOLN
0.2500 mg | Freq: Once | INTRAMUSCULAR | Status: DC | PRN
Start: 2013-04-06 — End: 2013-04-06

## 2013-04-06 MED ORDER — SENNOSIDES-DOCUSATE SODIUM 8.6-50 MG PO TABS
2.0000 | ORAL_TABLET | ORAL | Status: DC
  Administered 2013-04-07 – 2013-04-08 (×2): 2 via ORAL
  Filled 2013-04-06 (×2): qty 2

## 2013-04-06 MED ORDER — LACTATED RINGERS IV SOLN: 500.0000 mL | INTRAVENOUS | Status: DC | PRN

## 2013-04-06 MED ORDER — IBUPROFEN 600 MG PO TABS: 600.0000 mg | ORAL_TABLET | Freq: Four times a day (QID) | ORAL | Status: DC | PRN

## 2013-04-06 MED ORDER — CITRIC ACID-SODIUM CITRATE 334-500 MG/5ML PO SOLN: 30.0000 mL | ORAL | Status: DC | PRN

## 2013-04-06 MED ORDER — PRENATAL MULTIVITAMIN CH
1.0000 | ORAL_TABLET | Freq: Every day | ORAL | Status: DC
  Administered 2013-04-07 – 2013-04-08 (×2): 1 via ORAL
  Filled 2013-04-06 (×2): qty 1

## 2013-04-06 MED ORDER — IBUPROFEN 600 MG PO TABS
600.0000 mg | ORAL_TABLET | Freq: Four times a day (QID) | ORAL | Status: DC
  Administered 2013-04-06 – 2013-04-08 (×8): 600 mg via ORAL
  Filled 2013-04-06 (×8): qty 1

## 2013-04-06 MED ORDER — OXYTOCIN BOLUS FROM INFUSION: 500.0000 mL | INTRAVENOUS | Status: DC

## 2013-04-06 MED ORDER — LANOLIN HYDROUS EX OINT: TOPICAL_OINTMENT | CUTANEOUS | Status: DC | PRN

## 2013-04-06 MED ORDER — OXYTOCIN 40 UNITS IN LACTATED RINGERS INFUSION - SIMPLE MED: 62.5000 mL/h | INTRAVENOUS | Status: DC

## 2013-04-06 MED ORDER — OXYTOCIN 40 UNITS IN LACTATED RINGERS INFUSION - SIMPLE MED: 1.0000 m[IU]/min | INTRAVENOUS | Status: DC

## 2013-04-06 MED ORDER — ONDANSETRON HCL 4 MG/2ML IJ SOLN: 4.0000 mg | INTRAMUSCULAR | Status: DC | PRN

## 2013-04-06 MED ORDER — MISOPROSTOL 25 MCG QUARTER TABLET: 25.0000 ug | ORAL_TABLET | Freq: Once | ORAL | Status: DC

## 2013-04-06 MED ORDER — ONDANSETRON HCL 4 MG/2ML IJ SOLN: 4.0000 mg | Freq: Four times a day (QID) | INTRAMUSCULAR | Status: DC | PRN

## 2013-04-06 MED ORDER — LIDOCAINE HCL (PF) 1 % IJ SOLN
INTRAMUSCULAR | Status: DC | PRN
  Administered 2013-04-06 (×3): 5 mL

## 2013-04-06 MED ORDER — ACETAMINOPHEN 325 MG PO TABS: 650.0000 mg | ORAL_TABLET | ORAL | Status: DC | PRN

## 2013-04-06 MED ORDER — FENTANYL 2.5 MCG/ML BUPIVACAINE 1/10 % EPIDURAL INFUSION (WH - ANES)
14.0000 mL/h | INTRAMUSCULAR | Status: DC | PRN
  Filled 2013-04-06: qty 125

## 2013-04-06 MED ORDER — PHENYLEPHRINE 40 MCG/ML (10ML) SYRINGE FOR IV PUSH (FOR BLOOD PRESSURE SUPPORT)
80.0000 ug | PREFILLED_SYRINGE | INTRAVENOUS | Status: DC | PRN
  Filled 2013-04-06: qty 2

## 2013-04-06 MED ORDER — TERBUTALINE SULFATE 1 MG/ML IJ SOLN: 0.2500 mg | Freq: Once | INTRAMUSCULAR | Status: DC | PRN

## 2013-04-06 MED ORDER — LIDOCAINE HCL (PF) 1 % IJ SOLN
30.0000 mL | INTRAMUSCULAR | Status: DC | PRN
  Filled 2013-04-06 (×2): qty 30

## 2013-04-06 MED ORDER — DIBUCAINE 1 % RE OINT
1.0000 "application " | TOPICAL_OINTMENT | RECTAL | Status: DC | PRN
  Administered 2013-04-07: 1 via RECTAL
  Filled 2013-04-06: qty 28

## 2013-04-06 MED ORDER — PHENYLEPHRINE 40 MCG/ML (10ML) SYRINGE FOR IV PUSH (FOR BLOOD PRESSURE SUPPORT)
80.0000 ug | PREFILLED_SYRINGE | INTRAVENOUS | Status: DC | PRN
  Filled 2013-04-06: qty 10
  Filled 2013-04-06: qty 2

## 2013-04-06 MED ORDER — WITCH HAZEL-GLYCERIN EX PADS
1.0000 "application " | MEDICATED_PAD | CUTANEOUS | Status: DC | PRN
  Administered 2013-04-07: 1 via TOPICAL

## 2013-04-06 MED ORDER — EPHEDRINE 5 MG/ML INJ
10.0000 mg | INTRAVENOUS | Status: DC | PRN
  Filled 2013-04-06: qty 4
  Filled 2013-04-06: qty 2

## 2013-04-06 MED ORDER — OXYCODONE-ACETAMINOPHEN 5-325 MG PO TABS
1.0000 | ORAL_TABLET | ORAL | Status: DC | PRN
  Administered 2013-04-07 – 2013-04-08 (×5): 1 via ORAL
  Filled 2013-04-06 (×5): qty 1

## 2013-04-06 MED ORDER — DIPHENHYDRAMINE HCL 25 MG PO CAPS: 25.0000 mg | ORAL_CAPSULE | Freq: Four times a day (QID) | ORAL | Status: DC | PRN

## 2013-04-06 MED ORDER — LACTATED RINGERS IV SOLN
INTRAVENOUS | Status: DC
  Administered 2013-04-06 (×2): via INTRAVENOUS

## 2013-04-06 MED ORDER — SIMETHICONE 80 MG PO CHEW: 80.0000 mg | CHEWABLE_TABLET | ORAL | Status: DC | PRN

## 2013-04-06 MED ORDER — OXYTOCIN 40 UNITS IN LACTATED RINGERS INFUSION - SIMPLE MED
1.0000 m[IU]/min | INTRAVENOUS | Status: DC
  Administered 2013-04-06: 2 m[IU]/min via INTRAVENOUS
  Filled 2013-04-06: qty 1000

## 2013-04-06 MED ORDER — ZOLPIDEM TARTRATE 5 MG PO TABS: 5.0000 mg | ORAL_TABLET | Freq: Every evening | ORAL | Status: DC | PRN

## 2013-04-06 MED ORDER — OXYCODONE-ACETAMINOPHEN 5-325 MG PO TABS: 1.0000 | ORAL_TABLET | ORAL | Status: DC | PRN

## 2013-04-06 MED ORDER — DIPHENHYDRAMINE HCL 50 MG/ML IJ SOLN: 12.5000 mg | INTRAMUSCULAR | Status: DC | PRN

## 2013-04-06 MED ORDER — FENTANYL 2.5 MCG/ML BUPIVACAINE 1/10 % EPIDURAL INFUSION (WH - ANES)
INTRAMUSCULAR | Status: DC | PRN
  Administered 2013-04-06: 14 mL/h via EPIDURAL

## 2013-04-06 MED ORDER — LACTATED RINGERS IV SOLN: 500.0000 mL | Freq: Once | INTRAVENOUS | Status: DC

## 2013-04-06 MED ORDER — BENZOCAINE-MENTHOL 20-0.5 % EX AERO
1.0000 "application " | INHALATION_SPRAY | CUTANEOUS | Status: DC | PRN
  Administered 2013-04-06: 1 via TOPICAL
  Filled 2013-04-06: qty 56

## 2013-04-06 MED ORDER — EPHEDRINE 5 MG/ML INJ
10.0000 mg | INTRAVENOUS | Status: DC | PRN
  Filled 2013-04-06: qty 2

## 2013-04-06 MED ORDER — ONDANSETRON HCL 4 MG PO TABS: 4.0000 mg | ORAL_TABLET | ORAL | Status: DC | PRN

## 2013-04-06 NOTE — Anesthesia Procedure Notes (Signed)
Epidural Patient location during procedure: OB  Staffing Anesthesiologist: Megahn Killings Performed by: anesthesiologist   Preanesthetic Checklist Completed: patient identified, site marked, surgical consent, pre-op evaluation, timeout performed, IV checked, risks and benefits discussed and monitors and equipment checked  Epidural Patient position: sitting Prep: ChloraPrep Patient monitoring: heart rate, continuous pulse ox and blood pressure Approach: right paramedian Injection technique: LOR saline  Needle:  Needle type: Tuohy  Needle gauge: 17 G Needle length: 9 cm and 9 Needle insertion depth: 5 cm Catheter type: closed end flexible Catheter size: 20 Guage Catheter at skin depth: 10 cm Test dose: negative  Assessment Events: blood not aspirated, injection not painful, no injection resistance, negative IV test and no paresthesia  Additional Notes   Patient tolerated the insertion well without complications.   

## 2013-04-06 NOTE — H&P (Signed)
Amber Terrell is a 38 y.o. female presenting at 37 wks, G2P0101, for labor induction per maternal request due to childcare arrangement. No UCs/ bleeding/ leaking. Good FMs.  P[facility]are- [facility], Clomid pregnancy (5th cycle), uncomplicated course. 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 H/o LEEP in 2011, 1st SVD at 36.4 wks, Rh negative.    History OB History   Grav Para Term Preterm Abortions TAB SAB Ect Mult Living   2 1  1      1      Past Medical History  Diagnosis Date  . No pertinent past medical history   . Routine postpartum follow-up 10/21/2010  . 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 Vaginal Pap smear, abnormal    Past Surgical History  Procedure Laterality Date  . Wisdom tooth extraction    . Leep  01/13/2010   Family History: family history includes Alcohol abuse in her maternal uncle; Heart disease in her maternal grandfather; Hyperlipidemia in her brother, maternal grandmother, maternal uncle, and mother; Other in her father; Seizures in her father; Stroke in her father. Social History:  reports that she has never smoked. She does not have any smokeless tobacco history on file. She reports that she does not drink alcohol or use illicit drugs.   Prenatal Transfer Tool  Maternal Diabetes: No Genetic Screening: Declined Maternal Ultrasounds/Referrals: Normal Fetal Ultrasounds or other Referrals:  None Maternal Substance Abuse:  No Significant Maternal Medications:  None (Clomid for conception) Significant Maternal Lab Results:  Lab values include: Group B Strep negative, Rh negative, s/p Rhogam.   ROS neg     Blood pressure 121/70, pulse 89, temperature 98.3 F (36.8 C), temperature source Oral, resp. rate 20, height 5\' 6"  (1.676 m), weight 174 lb (78.926 kg), last menstrual period 07/07/2012, unknown if currently breastfeeding. Exam Physical Exam  A&O x 3, no acute distress. Pleasant HEENT neg, no thyromegaly Lungs CTA bilat CV RRR, S1S2  normal Abdo soft, non tender, non acute Extr no edema/ tenderness Pelvic 1-2/50%/-2/VTX, intact, adqt pelvis.  FHT  150s/ + accels/ no decels/ mod variability category I Toco  none  Prenatal labs: ABO, Rh: A/Negative/-- (07/02 0000) Antibody: Negative (07/02 0000) Rubella: Immune (07/02 0000) RPR: Nonreactive (07/02 0000)  HBsAg: Negative (07/02 0000)  HIV: Non-reactive (07/02 0000)  GBS: Negative (01/13 0000)  Glucola normal   Assessment/Plan: 38 yo, G2P0101 at 39 wks for elective IOL due to child care arrangement. GBS neg. Rh neg.  Routine admit, pitocin IOL, Epidural ok anytime. Anticipate SVD, EFW 6.1/2 lbs   Leshia Kope R 04/06/2013, 8:13 AM

## 2013-04-06 NOTE — Progress Notes (Signed)
Arria Naim is a 38 y.o. G2P0101 at [redacted]w[redacted]d by 1st trim sono, Clomid pregnancy 5th cycle. IOL for social reason. No complaints, feels some UCs.   Objective: BP 116/73  Pulse 73  Temp(Src) 98.5 F (36.9 C) (Oral)  Resp 20  Ht 5\' 6"  (1.676 m)  Wt 174 lb (78.926 kg)  BMI 28.10 kg/m2  LMP 07/07/2012    FHT:  FHR: 150 bpm, variability: moderate,  accelerations:  Present,  decelerations:  Absent UC:   regular, every 4-5 minutes SVE:   Dilation: 2 Effacement (%): 60 Station: -2 Exam by:: Basha Krygier AROM, clear copious fluid, head came down to -2, well applied to cervix.   Labs: Lab Results  Component Value Date   WBC 9.4 04/06/2013   HGB 12.5 04/06/2013   HCT 36.7 04/06/2013   MCV 94.8 04/06/2013   PLT 129* 04/06/2013   Assessment / Plan: Induction of labor due to term with favorable cervix,  progressing well on pitocin  Labor: Progressing normally Fetal Wellbeing:  Category I Pain Control:  Labor support without medications, epidural per pt's call I/D:  GBS(-) Anticipated MOD:  NSVD  Daleisa Halperin R 04/06/2013, 11:50 AM

## 2013-04-06 NOTE — Anesthesia Preprocedure Evaluation (Signed)

## 2013-04-07 LAB — CBC
HCT: 32.8 % — ABNORMAL LOW (ref 36.0–46.0)
HEMOGLOBIN: 11.3 g/dL — AB (ref 12.0–15.0)
MCH: 32.5 pg (ref 26.0–34.0)
MCHC: 34.5 g/dL (ref 30.0–36.0)
MCV: 94.3 fL (ref 78.0–100.0)
Platelets: 121 10*3/uL — ABNORMAL LOW (ref 150–400)
RBC: 3.48 MIL/uL — ABNORMAL LOW (ref 3.87–5.11)
RDW: 13.2 % (ref 11.5–15.5)
WBC: 12.1 10*3/uL — AB (ref 4.0–10.5)

## 2013-04-07 MED ORDER — RHO D IMMUNE GLOBULIN 1500 UNIT/2ML IJ SOLN
300.0000 ug | Freq: Once | INTRAMUSCULAR | Status: DC
  Filled 2013-04-07: qty 2

## 2013-04-07 MED ORDER — RHO D IMMUNE GLOBULIN 1500 UNIT/2ML IJ SOLN
300.0000 ug | Freq: Once | INTRAMUSCULAR | Status: AC
  Administered 2013-04-07: 300 ug via INTRAMUSCULAR
  Filled 2013-04-07: qty 2

## 2013-04-07 NOTE — Progress Notes (Signed)
Patient ID: Amber Terrell, female   DOB: Jul 04, 1975, 38 y.o.   MRN: [id number] PPD # 1  Subjective: Pt reports feeling sore, but well.  More pain assoc with cramping while breast feeding/ Pain controlled with ibuprofen and percocet Tolerating po/ Voiding without problems/ No n/v Bleeding is light Newborn info:  Information for the patient's newborn:  Amber Terrell [[id number]]  female Feeding: breast  (Amber Terrell)   Objective:  VS: Blood pressure 105/67, pulse 70, temperature 98.2 F (36.8 C), temperature source Oral, resp. rate 18    Recent Labs  04/06/13 0805 04/07/13 0610  WBC 9.4 12.1*  HGB 12.5 11.3*  HCT 36.7 32.8*  PLT 129* 121*    Blood type: A NEG Rubella: Immune    Physical Exam:  General:  alert, cooperative and no distress CV: Regular rate and rhythm Resp: clear Abdomen: soft, nontender, normal bowel sounds Uterine Fundus: firm, below umbilicus, nontender Perineum: good approximation with mild edema.  Small hematoma noted to perineum, approx 1 x 1 cm Lochia: minimal Ext: Homans sign is negative, no sign of DVT and no edema, redness or tenderness in the calves or thighs   A/P: PPD # 1/ G2P1102/ S/P: SVD w/ 2nd repair Small perineal hematoma; urged consistent ice pack until 24 hr post delivery and then sitz baths Doing well Continue routine post partum orders Anticipate D/C home in AM    Darleen Crocker, MSN, [facility] 04/07/2013, 10:24 AM

## 2013-04-07 NOTE — Discharge Summary (Signed)
Obstetric Discharge Summary Reason for Admission: 39 wks, G2P0101, for labor induction per maternal request due to childcare arrangement. No UCs/ bleeding/ leaking. Good FMs.  [facility], Clomid pregnancy (5th cycle), uncomplicated course. 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 H/o LEEP in 2011, 1st SVD at 36.4 wks, Rh negative.   Prenatal Procedures: NST and ultrasound Intrapartum Procedures: spontaneous vaginal delivery Postpartum Procedures: none Complications-Operative and Postpartum: 2nd degree perineal laceration Hemoglobin  Date Value Range Status  04/07/2013 11.3* 12.0 - 15.0 g/dL Final     HCT  Date Value Range Status  04/07/2013 32.8* 36.0 - 46.0 % Final    Physical Exam:  General: alert, cooperative and no distress Lochia: appropriate Uterine Fundus: firm Incision: n/a DVT Evaluation: No evidence of DVT seen on physical exam. Negative Homan's sign.  Discharge Diagnoses: G2 P2 s/p SVD with 2nd deg repair  Discharge Information: Date: 04/07/2013 Activity: pelvic rest Diet: routine Medications: PNV, Ibuprofen, Colace and Percocet Condition: stable Instructions: refer to practice specific booklet Discharge to: home Follow-up Information   Follow up with MODY,VAISHALI R, MD In 6 weeks.   Specialty:  Obstetrics and Gynecology   Contact information:   [place] [id number] [phone]       Newborn Data: Live born female on 04/06/13 Birth Weight: 6 lb 14.4 oz (3130 g) APGAR: 9, 9  Home with mother.  Duy Lemming K 04/07/2013, 5:12 PM

## 2013-04-07 NOTE — Anesthesia Postprocedure Evaluation (Signed)
  Anesthesia Post-op Note  Patient: Amber Terrell  Procedure(s) Performed: * No procedures listed *  Patient Location: PACU  Anesthesia Type:Epidural  Level of Consciousness: awake  Airway and Oxygen Therapy: Patient Spontanous Breathing  Post-op Pain: none  Post-op Assessment: Patient's Cardiovascular Status Stable, Respiratory Function Stable, Patent Airway, NAUSEA AND VOMITING PRESENT, Adequate PO intake, Pain level controlled, No headache, No backache, No residual numbness and No residual motor weakness  Post-op Vital Signs: Reviewed and stable  Complications: No apparent anesthesia complications

## 2013-04-08 LAB — RH IG WORKUP (INCLUDES ABO/RH)
ABO/RH(D): A NEG
ANTIBODY SCREEN: POSITIVE
DAT, IGG: NEGATIVE
Fetal Screen: NEGATIVE
Gestational Age(Wks): 39
Unit division: 0

## 2013-04-08 MED ORDER — SENNOSIDES-DOCUSATE SODIUM 8.6-50 MG PO TABS: 2.0000 | ORAL_TABLET | Freq: Every evening | ORAL | Status: DC | PRN

## 2013-04-08 MED ORDER — OXYCODONE-ACETAMINOPHEN 5-325 MG PO TABS: 1.0000 | ORAL_TABLET | Freq: Four times a day (QID) | ORAL | Status: DC | PRN

## 2013-04-08 MED ORDER — IBUPROFEN 600 MG PO TABS: 600.0000 mg | ORAL_TABLET | Freq: Four times a day (QID) | ORAL | Status: DC | PRN

## 2013-04-08 NOTE — Progress Notes (Signed)
Patient ID: Amber Terrell, female   DOB: [birth year], 38 y.o.   MRN: [id number] PPD # 2  Subjective: Pt reports feeling well and eager for d/c home / Pain controlled with ibuprofen and percocet Tolerating po/ Voiding without problems/ No n/v Bleeding is light/ Newborn info:  Information for the patient's newborn:  Cory, Girl Lydiah [[id number]]  female Feeding: breast    Objective:  VS: Blood pressure 118/78, pulse 69, temperature 98.7 F (37.1 C), temperature source Oral, resp. rate 18.    Recent Labs  04/06/13 0805 04/07/13 0610  WBC 9.4 12.1*  HGB 12.5 11.3*  HCT 36.7 32.8*  PLT 129* 121*    Blood type: A NEG Rubella: Immune    Physical Exam:  General:  alert, cooperative and no distress CV: Regular rate and rhythm Resp: clear Abdomen: soft, nontender, normal bowel sounds Uterine Fundus: firm, below umbilicus, nontender Perineum: well approximated; 2 small hematomas noted, one at left labia minora approx 1/2 to 1 cm  and at perineum approx 1cm. Lochia: minimal Rectum:  Small, fleshy pink hemorrhoid noted Ext: Homans sign is negative, no sign of DVT and no edema, redness or tenderness in the calves or thighs    A/P: PPD # 2/ G2P1102/ S/P: SVD with 2nd deg repair Small perineal and labial hematoma.  Frequent sitz baths Doing well and stable for discharge home RX: Ibuprofen 600mg  po Q 6 hrs prn pain #30 Refill x 1 Percocet 5/325 1 to 2 po Q 4 hrs prn pain #15 No refill Colace 100mg  po up to TID prn #30 Ref x 1 WOB/GYN booklet given Routine pp visit in [place], MSN, [place] [facility] 04/08/2013, 9:16 AM

## 2013-04-09 NOTE — Discharge Summary (Signed)
Reviewed and agree with note and plan. V.Candis Kabel, MD  

## 2013-04-13 ENCOUNTER — Inpatient Hospital Stay (HOSPITAL_COMMUNITY)
Admission: AD | Admit: 2013-04-13 | Payer: BC Managed Care – PPO | Source: Ambulatory Visit | Admitting: Obstetrics & Gynecology

## 2013-04-14 ENCOUNTER — Ambulatory Visit (HOSPITAL_COMMUNITY)
Admission: RE | Admit: 2013-04-14 | Discharge: 2013-04-14 | Disposition: A | Discharge status: Home / Self Care | Payer: BC Managed Care – PPO | Source: Ambulatory Visit | Attending: Obstetrics & Gynecology | Admitting: Obstetrics & Gynecology

## 2013-04-14 NOTE — Lactation Note (Signed)
Adult Lactation Consultation Outpatient Visit Note; Mom here today to assess latch- mom reports that her nipples are getting better but thinks sometimes the latch is shallow. Both nipples pink. Left nipple raw on tip- has been using all purpose nipple cream. Reports that left nipple was the worst with cracking and bleeding.   Patient Name: Amber Terrell Date of Birth: 02-Aug-1975 Gestational Age at Delivery: Unknown Type of Delivery:   Breastfeeding History: Frequency of Breastfeeding: q 1/2-2 during the day, slept 6 hours last night.  Length of Feeding: 15-20 min Voids: 6-8 had void while here for appointment Stools: 2-3 had yellow stool while here for appointment  Supplementing / Method: Pumping:  Type of Pump: Medela   Frequency: 1-2 times/day for comfort  Volume:  1-2 oz  Comments:    Consultation Evaluation:  Initial Feeding Assessment: Breast very full. Baby has not fed since 3 am, Mom reports that she pumped off 1/2 oz before coming to appointment Pre-feed Weight: 6- 13.3  3100g Post-feed Weight: 6- 15.3  3156g Amount Transferred: 56 cc's Comments: Mom using cross cradle hold- Encouraged to wait for wide open mouth. Baby latched well with assist and mom reports that she feel tugging, only slight pain- probably because nipple is raw to begin with. Alice nursed for 15 minutes then not sucking. Unlatched and weighed. Attempted to latch  In football hold so mom could try a different position but Alice would not latch. Mom states she feels comfortable with this position. Encouraged to change positions a few times at day so the baby isn't latched the exact same way every feeding. Attempted to latch to other breast, but [place] Clap would not latch.    Total Breast milk Transferred this Visit: 21 cc's Total Supplement Given: 0  Additional Interventions: To wait for wide open mouth and keep the baby close to the breast throughout the feeding. Reports that sometimes she pulls away from  breast- probably because the milk is coming too fast for her. May need to pump if breast is too full. No further questions at present,. To call prn. Reviewed [facility] as resource for support.  Follow-Up With Ped To call here is needs another appointment     Truddie Crumble 04/14/2013, 9:50 AM

## 2013-04-25 ENCOUNTER — Other Ambulatory Visit: Payer: Self-pay | Admitting: Family

## 2013-12-29 ENCOUNTER — Encounter (HOSPITAL_COMMUNITY): Payer: Self-pay

## 2015-01-01 ENCOUNTER — Encounter: Payer: Self-pay | Admitting: Neurology

## 2015-01-01 ENCOUNTER — Ambulatory Visit (INDEPENDENT_AMBULATORY_CARE_PROVIDER_SITE_OTHER): Payer: BLUE CROSS/BLUE SHIELD | Admitting: Neurology

## 2015-01-01 VITALS — BP 102/64 | HR 70 | Ht 66.0 in | Wt 151.0 lb

## 2015-01-01 DIAGNOSIS — G518 Other disorders of facial nerve: Secondary | ICD-10-CM | POA: Diagnosis not present

## 2015-01-01 DIAGNOSIS — G5139 Clonic hemifacial spasm, unspecified: Secondary | ICD-10-CM

## 2015-01-01 NOTE — Progress Notes (Signed)
Amber Terrell was seen today in neurologic consultation at the request of FULP, CAMMIE, MD.  The consultation is for the evaluation of facial spasm.  It started approximately 1 month ago.  She states that it is always within a mile of her starting running.  She will notice facial spasm on the right and it will last intermittently throughout the run (runs 3-5 miles) and it takes her 30 min to 1 hr to run.  Once stopped running, it will immediately stopped.  It never happened previously.  No fam hx of similar.  States that father had brain tumor when he was in his 80's and had surgery to remove it and had seizures since then.  He has another brain tumor now but is too small to be removed.  She does not know what type of tumor he has or had.  She was given a RX for prednisone but decided not to take it.  She is on no medication.  She states that she has always had fidgety movement of the right arm but no tremor there.  Neuroimaging has not previously been performed.    ALLERGIES:   Allergies  Allergen Reactions  . Penicillins Rash    CURRENT MEDICATIONS:  Outpatient Encounter Prescriptions as of 01/01/2015  Medication Sig  . [DISCONTINUED] cholecalciferol (VITAMIN D) 1000 UNITS tablet Take 1,000 Units by mouth daily.    . [DISCONTINUED] diphenhydrAMINE (BENADRYL) 25 MG tablet Take 25 mg by mouth at bedtime as needed for sleep.  . [DISCONTINUED] docusate sodium (COLACE) 100 MG capsule Take 100 mg by mouth 2 (two) times daily.    . [DISCONTINUED] ibuprofen (ADVIL,MOTRIN) 600 MG tablet Take 1 tablet (600 mg total) by mouth every 6 (six) hours as needed.  . [DISCONTINUED] Multiple Vitamins-Minerals (AIRBORNE PO) Take 1 tablet by mouth daily.  . [DISCONTINUED] oxyCODONE-acetaminophen (PERCOCET/ROXICET) 5-325 MG per tablet Take 1-2 tablets by mouth every 6 (six) hours as needed for severe pain (moderate - severe pain).  . [DISCONTINUED] Prenatal Vit-Fe Fumarate-FA (PRENATAL MULTIVITAMIN) TABS tablet  Take 1 tablet by mouth daily at 12 noon.  . [DISCONTINUED] senna-docusate (SENOKOT-S) 8.6-50 MG per tablet Take 2 tablets by mouth at bedtime as needed for mild constipation.   No facility-administered encounter medications on file as of 01/01/2015.    PAST MEDICAL HISTORY:   Past Medical History  Diagnosis Date  . No pertinent past medical history   . Routine postpartum follow-up 10/21/2010  . Cancer (HCC)     precancerous cervical cells... pt had LEEP  . Vaginal Pap smear, abnormal   . Postpartum care following vaginal delivery (2/8) 04/06/2013    PAST SURGICAL HISTORY:   Past Surgical History  Procedure Laterality Date  . Wisdom tooth extraction    . Leep  01/13/2010    SOCIAL HISTORY:   Social History   Social History  . Marital Status: Married    Spouse Name: N/A  . Number of Children: N/A  . Years of Education: N/A   Occupational History  . ER nurse     part time   Social History Main Topics  . Smoking status: Never Smoker   . Smokeless tobacco: Not on file  . Alcohol Use: 0.0 oz/week    0 Standard drinks or equivalent per week     Comment: once a month  . Drug Use: No  . Sexual Activity: Yes   Other Topics Concern  . Not on file   Social History Narrative    FAMILY  HISTORY:   Family Status  Relation Status Death Age  . Father Alive     brain tumor, stroke, seizures  . Mother Alive     Hyperlipidemia  . Brother Alive     hyperlipidemia  . Brother Alive     healthy  . Son Alive     healthy  . Daughter Alive     healthy    ROS:  A complete 10 system review of systems was obtained and was unremarkable apart from what is mentioned above.  PHYSICAL EXAMINATION:    VITALS:   Filed Vitals:   01/01/15 1439  BP: 102/64  Pulse: 70  Height: 5\' 6"  (1.676 m)  Weight: 151 lb (68.493 kg)    GEN:  Normal appears female in no acute distress.  Appears stated age. HEENT:  Normocephalic, atraumatic. The mucous membranes are moist. The superficial  temporal arteries are without ropiness or tenderness. Cardiovascular: Regular rate and rhythm. Lungs: Clear to auscultation bilaterally. Neck/Heme: There are no carotid bruits noted bilaterally.  NEUROLOGICAL: Orientation:  The patient is alert and oriented x 3.  Fund of knowledge is appropriate.  Recent and remote memory intact.  Attention span and concentration normal.  Repeats and names without difficulty. Cranial nerves: There is good facial symmetry. The pupils are equal round and reactive to light bilaterally. Fundoscopic exam reveals clear disc margins bilaterally. Extraocular muscles are intact and visual fields are full to confrontational testing. Speech is fluent and clear. Soft palate rises symmetrically and there is no tongue deviation. Hearing is intact to conversational tone. Tone: Tone is good throughout. Sensation: Sensation is intact to light touch and pinprick throughout (facial, trunk, extremities). Vibration is intact at the bilateral big toe. There is no extinction with double simultaneous stimulation. There is no sensory dermatomal level identified. Coordination:  The patient has no difficulty with RAM's or FNF bilaterally. Motor: Strength is 5/5 in the bilateral upper and lower extremities.  Shoulder shrug is equal and symmetric. There is no pronator drift.  There are no fasciculations noted. DTR's: Deep tendon reflexes are 2/4 at the bilateral biceps, triceps, brachioradialis, patella and achilles.  Plantar responses are downgoing bilaterally. Gait and Station: The patient is able to ambulate without difficulty. The patient is able to heel toe walk without any difficulty. The patient is able to ambulate in a tandem fashion. The patient is able to stand in the Romberg position.   IMPRESSION/PLAN  1.  Right hemifacial spasm, by history  -I did not see any facial spasm or asymmetry today, but she describes it fairly well.  It is somewhat unusual that it only occurs with  exercise, so I think we need to do an MRI of the brain just to make sure that there is not some type of focal irritation or aneurysm that is close to the nerve.  I talked to her about various treatments and we decided to hold on any of that right now since this only occurs when she exercises.  We talked about the risks and benefits of Botox, but again I do not think she needs that right now.  We will call her back with the results of her MRI.  Much greater than 50% of this visit was spent in counseling with the patient and the family.  Total face to face time:  60 min

## 2015-01-01 NOTE — Patient Instructions (Signed)
1. We have scheduled you at [facility] for your MRI on 01/13/2015 at 8:00 am. Please arrive 15 minutes prior and go to 1st floor radiology. If you need to reschedule for any reason please call [phone].

## 2015-01-13 ENCOUNTER — Telehealth: Payer: Self-pay | Admitting: Neurology

## 2015-01-13 ENCOUNTER — Ambulatory Visit (HOSPITAL_COMMUNITY)
Admission: RE | Admit: 2015-01-13 | Discharge: 2015-01-13 | Disposition: A | Discharge status: Home / Self Care | Payer: BLUE CROSS/BLUE SHIELD | Source: Ambulatory Visit | Attending: Neurology | Admitting: Neurology

## 2015-01-13 DIAGNOSIS — G5139 Clonic hemifacial spasm, unspecified: Secondary | ICD-10-CM

## 2015-01-13 DIAGNOSIS — G513 Clonic hemifacial spasm: Secondary | ICD-10-CM | POA: Diagnosis not present

## 2015-01-13 DIAGNOSIS — G518 Other disorders of facial nerve: Secondary | ICD-10-CM | POA: Diagnosis present

## 2015-01-13 MED ORDER — GADOBENATE DIMEGLUMINE 529 MG/ML IV SOLN
15.0000 mL | Freq: Once | INTRAVENOUS | Status: AC
  Administered 2015-01-13: 15 mL via INTRAVENOUS

## 2015-01-13 NOTE — Telephone Encounter (Signed)
-----   Message from Goodnight, DO sent at 01/13/2015 10:14 AM EST ----- Reviewed.  Please let pt know that MRI brain okay

## 2015-01-13 NOTE — Telephone Encounter (Signed)
Left message on machine for patient to call back.

## 2015-01-14 NOTE — Telephone Encounter (Signed)
Patient made aware MR normal. If spasms persist or get worse she will make a follow up appt.

## 2015-01-14 NOTE — Telephone Encounter (Signed)
Left another message on machine for patient to call back.

## 2015-01-25 ENCOUNTER — Ambulatory Visit: Payer: Self-pay | Admitting: Neurology

## 2015-01-27 ENCOUNTER — Telehealth: Payer: Self-pay | Admitting: Hematology

## 2015-01-27 NOTE — Telephone Encounter (Signed)
CALLED PT LEFT VM IN REF TO NP APPT. °

## 2015-01-28 ENCOUNTER — Telehealth: Payer: Self-pay | Admitting: Oncology

## 2015-01-28 NOTE — Telephone Encounter (Signed)
LT MESS FOR NEW PT REFERRAL °

## 2015-02-03 ENCOUNTER — Telehealth: Payer: Self-pay | Admitting: Genetic Counselor

## 2015-02-03 NOTE — Telephone Encounter (Signed)
Lt mess regarding new pt referral.  Appt date, time, and mailed out calendar

## 2015-03-02 ENCOUNTER — Telehealth: Payer: Self-pay | Admitting: Genetic Counselor

## 2015-03-02 NOTE — Telephone Encounter (Signed)
LM on VM about her appointment.  Was unclear for the reason of referral and wanted to ensure that there was not anything I needed to know ahead of time or whether there could be test results for her to track down.

## 2015-03-03 ENCOUNTER — Telehealth: Payer: Self-pay | Admitting: Oncology

## 2015-03-03 ENCOUNTER — Encounter: Payer: BC Managed Care – PPO | Admitting: Genetic Counselor

## 2015-03-03 ENCOUNTER — Telehealth: Payer: Self-pay | Admitting: Genetic Counselor

## 2015-03-03 ENCOUNTER — Other Ambulatory Visit: Payer: BC Managed Care – PPO

## 2015-03-03 NOTE — Telephone Encounter (Signed)
Referring Dr. Antony Blackbird Dx- Thrombocytopenia

## 2015-03-03 NOTE — Telephone Encounter (Signed)
Lt mess for pt regarding new pt referral.  Appt for today cancelled

## 2015-03-03 NOTE — Telephone Encounter (Signed)
new patient appt-s/w patient and gave np appt for 01/05 @ 2 w/Dr. Alen Blew Referring Dr.

## 2015-03-04 ENCOUNTER — Ambulatory Visit (HOSPITAL_BASED_OUTPATIENT_CLINIC_OR_DEPARTMENT_OTHER): Payer: BLUE CROSS/BLUE SHIELD | Admitting: Oncology

## 2015-03-04 VITALS — BP 118/72 | HR 73 | Temp 98.6°F | Resp 18 | Wt 152.1 lb

## 2015-03-04 DIAGNOSIS — D696 Thrombocytopenia, unspecified: Secondary | ICD-10-CM

## 2015-03-04 NOTE — Consult Note (Signed)
Reason for Referral: Thrombocytopenia.   HPI: 40 year old woman native of [place] and moved to this area the last 5 years from [place]. 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 She was noted to have thrombocytopenia dating back to 2012 after she was pregnant with her first child. Her platelet count was around 165 and dropped down to 125 after delivery of her first son. In February 2015 her blood count was around 129 and after the birth of her second child her platelet count continue to be around 121. Most recently she had a CBC done and showed normal white cell count and hemoglobin. Her platelet count was around 129. She is completely asymptomatic at this time. She does not report any bleeding or easy bruisability. She did not have any post partum bleeding or any complications. She had wisdom teeth removed without any bleeding postoperatively. She denied any constitutional symptoms at this time.  She does not report any headaches, blurry vision, syncope or seizures. She does not report any fevers or chills or sweats. Does not report any cough or hemoptysis or wheezing. Does not report any nausea, vomiting or abdominal pain. Does not report any constipation, diarrhea or hematochezia. She does not report any frequency urgency or has a. Does not report any skeletal complaints. Remaining review of systems unremarkable.   Past Medical History  Diagnosis Date  . No pertinent past medical history   . Routine postpartum follow-up 10/21/2010  . Cancer (HCC)     precancerous cervical cells... pt had LEEP  . Vaginal Pap smear, abnormal   . Postpartum care following vaginal delivery (2/8) 04/06/2013  :  Past Surgical History  Procedure Laterality Date  . Wisdom tooth extraction    . Leep  01/13/2010  :   Current outpatient prescriptions:  Marland Kitchen  Multiple Vitamin (MULTIVITAMIN) tablet, Take 1 tablet by mouth daily.,  Disp: , Rfl: :  Allergies  Allergen Reactions  . Penicillins Rash  :  Family History  Problem Relation Age of Onset  . Hyperlipidemia Mother   . Seizures Father   . Other Father     benign brain tumor  . Stroke Father   . Hyperlipidemia Brother   . Hyperlipidemia Maternal Uncle   . Alcohol abuse Maternal Uncle   . Hyperlipidemia Maternal Grandmother   . Heart disease Maternal Grandfather   :  Social History   Social History  . Marital Status: Married    Spouse Name: N/A  . Number of Children: N/A  . Years of Education: N/A   Occupational History  . ER nurse     part time   Social History Main Topics  . Smoking status: Never Smoker   . Smokeless tobacco: Not on file  . Alcohol Use: 0.0 oz/week    0 Standard drinks or equivalent per week     Comment: once a month  . Drug Use: No  . Sexual Activity: Yes   Other Topics Concern  . Not on file   Social History Narrative  :  Pertinent items are noted in HPI.  Exam: ECOG 0 Blood pressure 118/72, pulse 73, temperature 98.6 F (37 C), temperature source Oral, resp. rate 18, weight 152 lb 1.6 oz (68.992 kg), SpO2 100 %, unknown if currently breastfeeding. General appearance: alert and cooperative appeared without distress. Head: Normocephalic, without obvious abnormality Nose: Nares normal. Septum midline. Mucosa normal. No drainage or sinus tenderness. Throat: lips, mucosa, and tongue normal;  teeth and gums normal no oral ulcers or lesions. Neck: no adenopathy Back: negative Resp: clear to auscultation bilaterally Chest wall: no tenderness Cardio: regular rate and rhythm, S1, S2 normal, no murmur, click, rub or gallop GI: soft, non-tender; bowel sounds normal; no masses,  no organomegaly Extremities: extremities normal, atraumatic, no cyanosis or edema Pulses: 2+ and symmetric Skin: Skin color, texture, turgor normal. No rashes or lesions Lymph nodes: Cervical, supraclavicular, and axillary nodes  normal. Neurologic: Grossly normal  CBC    Component Value Date/Time   WBC 12.1* 04/07/2013 0610   RBC 3.48* 04/07/2013 0610   HGB 11.3* 04/07/2013 0610   HCT 32.8* 04/07/2013 0610   PLT 121* 04/07/2013 0610   MCV 94.3 04/07/2013 0610   MCH 32.5 04/07/2013 0610   MCHC 34.5 04/07/2013 0610   RDW 13.2 04/07/2013 0610      Chemistry   No results found for: NA, K, CL, CO2, BUN, CREATININE, GLU No results found for: CALCIUM, ALKPHOS, AST, ALT, BILITOT    Assessment and Plan:   40 year old woman with mild thrombocytopenia dates back to 2012. Her platelet count has fluctuated between 121 and 129 since her first pregnancy 2012. She has not reported any bleeding complications related to that. She has a normal CBC otherwise.  The differential diagnosis was discussed today which include gestational thrombocytopenia, immune thrombocytopenia or fluctuations of normal platelet count. Given the fact that her platelet count continues to be very close to normal range and she is completely asymptomatic I see no intervention is needed. This likely represents either mild ITP or fluctuation of a normal range platelets.  I have recommended occasional monitoring of her CBC on annual basis and a platelet count drop below 50, consideration for steroid trial would be needed. In all likelihood, this will not be needed and no further intervention would be required.   I see no evidence to suggest a hematological disorder such as TTP, HUS, DIC or any lymphoid malignancies.   I will be happy to see her in the future as needed.

## 2015-03-04 NOTE — Progress Notes (Signed)
Please see consult note.  

## 2017-03-02 IMAGING — MR MR HEAD WO/W CM
10 of 12 series · 34 of 48 positions shown · IV contrast (multihance)
Comparison: None.

CLINICAL DATA: RIGHT hemifacial spasm for 1 month. This occurred
while exercising. Family history of brain tumor.

EXAM:
MRI HEAD WITHOUT AND WITH CONTRAST
TECHNIQUE: Multiplanar, multiecho pulse sequences of the brain and surrounding
structures were obtained without and with intravenous contrast.
CONTRAST:  15mL MULTIHANCE GADOBENATE DIMEGLUMINE 529 MG/ML IV SOLN

[Series 3: T1 · sagittal · 5.0mm · 0.47mm/px · 3 of 23 slices shown]
[im 1/23]
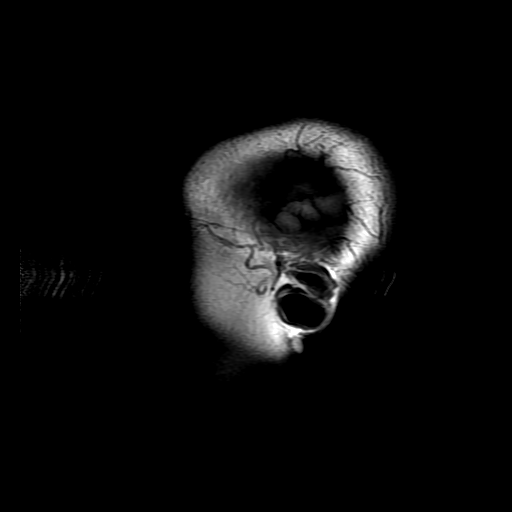
[im 12/23]
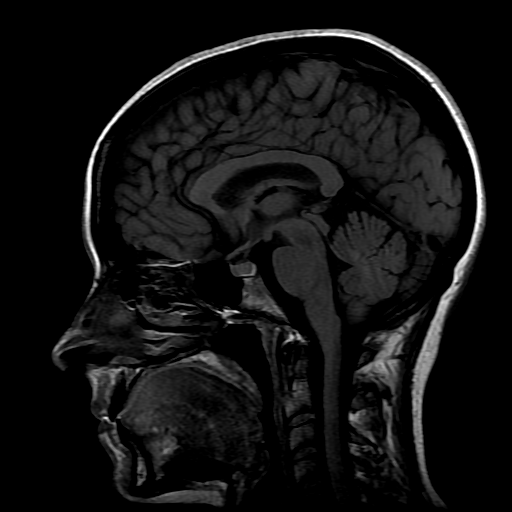
[im 23/23]
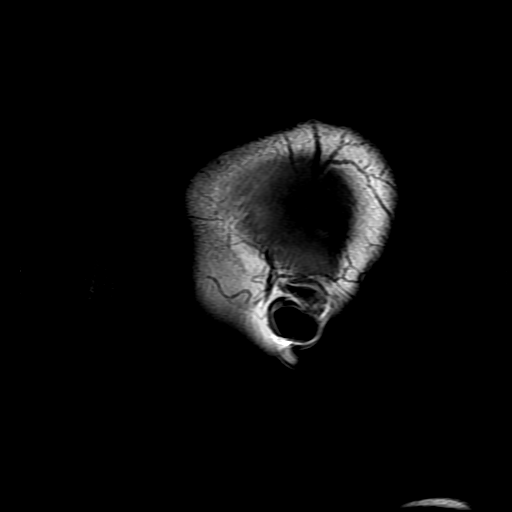

[Series 4: DWI · axial · 3.0mm · 1.09mm/px · z∈[-47,+88]mm · 9 of 92 slices shown (1 of 4)]
[im 1/92]
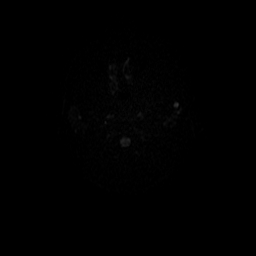
[im 12/92]
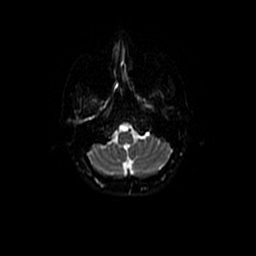
[im 23/92]
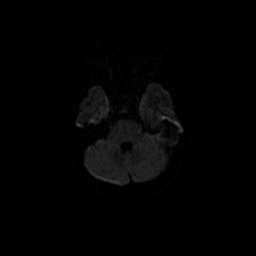
[im 35/92]
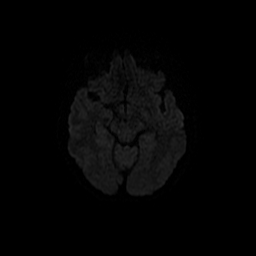
[im 46/92]
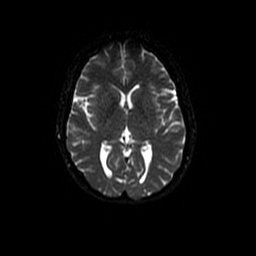
[im 57/92]
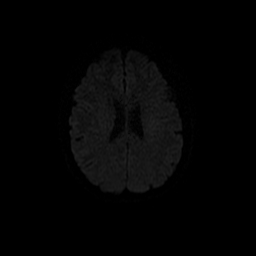
[im 69/92]
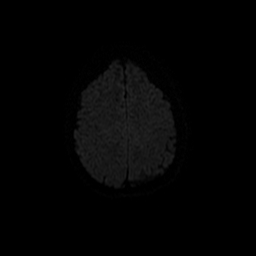
[im 80/92]
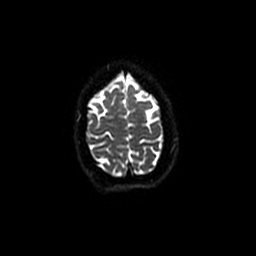
[im 92/92]
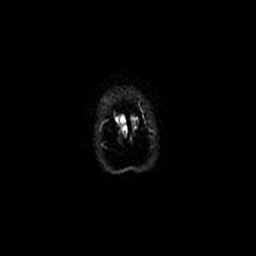

[Series 5: T2 · axial · 5.0mm · 0.43mm/px · z∈[-50,+94]mm · 2 of 25 slices shown]
[im 1/25]
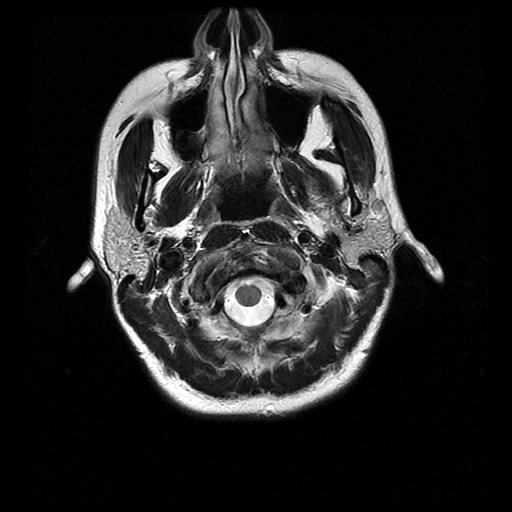
[im 25/25]
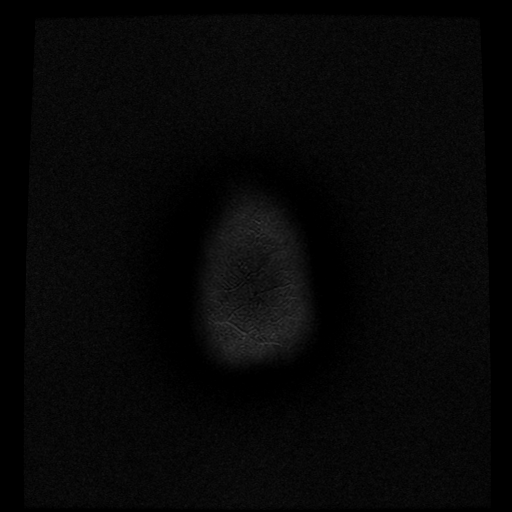

[Series 6: DWI · coronal · 5.0mm · 1.09mm/px · 6 of 66 slices shown (2 of 4)]
[im 1/66]
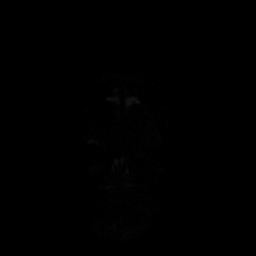
[im 14/66]
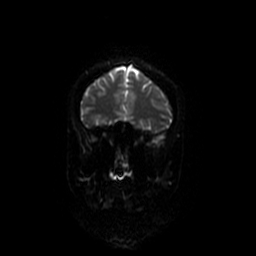
[im 27/66]
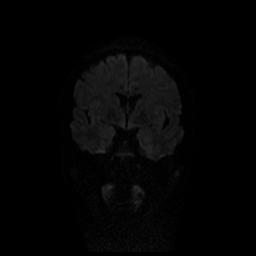
[im 40/66]
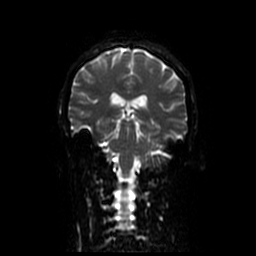
[im 53/66]
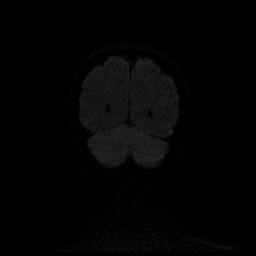
[im 66/66]
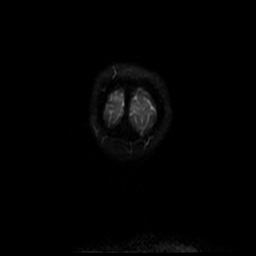

[Series 7: FLAIR · axial · 5.0mm · 0.43mm/px · z∈[-50,+94]mm · 2 of 25 slices shown]
[im 1/25]
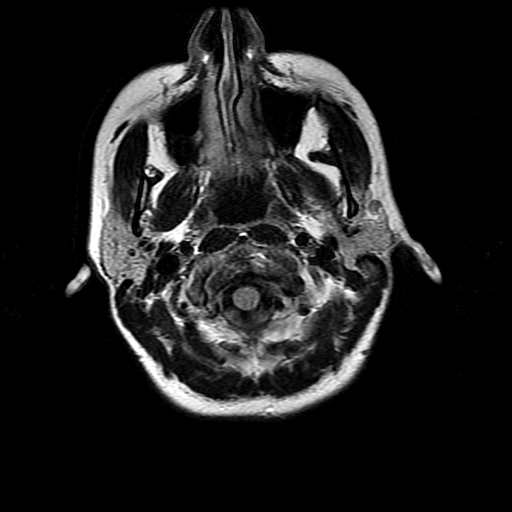
[im 25/25]
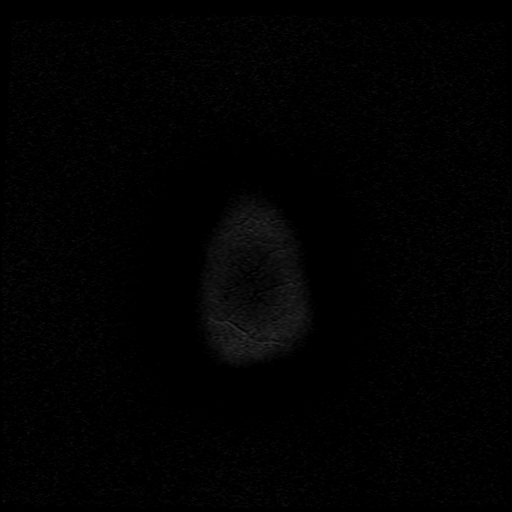

[Series 8: ax mpgr · axial · 5.0mm · 0.43mm/px · 1 of 25 slices shown]
[im 1/25]
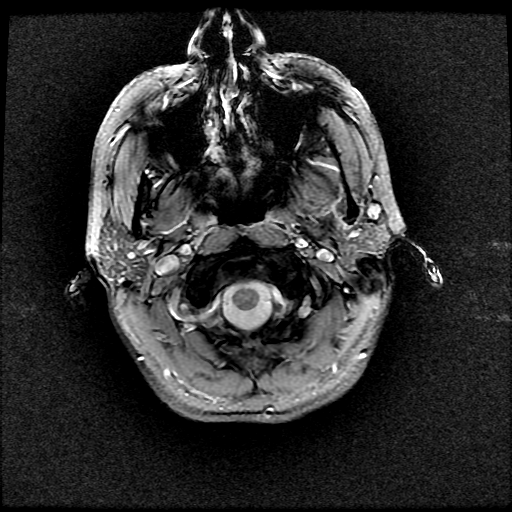

[Series 10: T2 post-contrast · coronal · 5.0mm · 0.39mm/px · 2 of 27 slices shown]
[im 1/27]
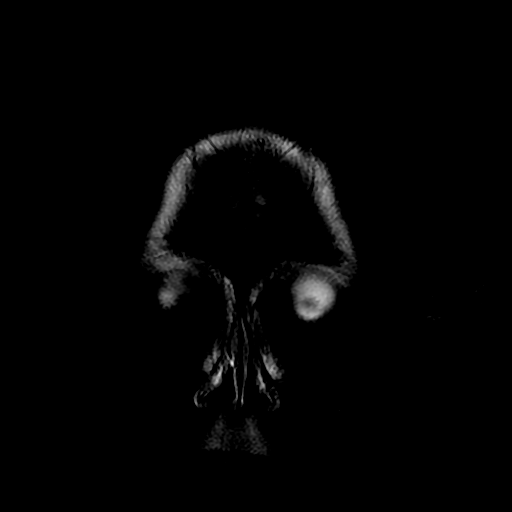
[im 27/27]
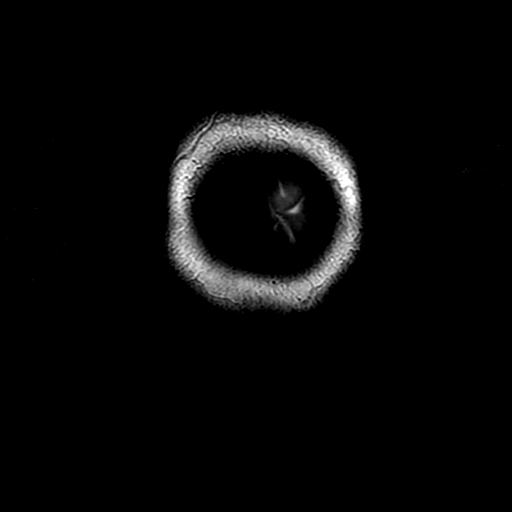

[Series 12: T1 post-contrast · coronal · 5.0mm · 0.39mm/px · 2 of 27 slices shown]
[im 1/27]
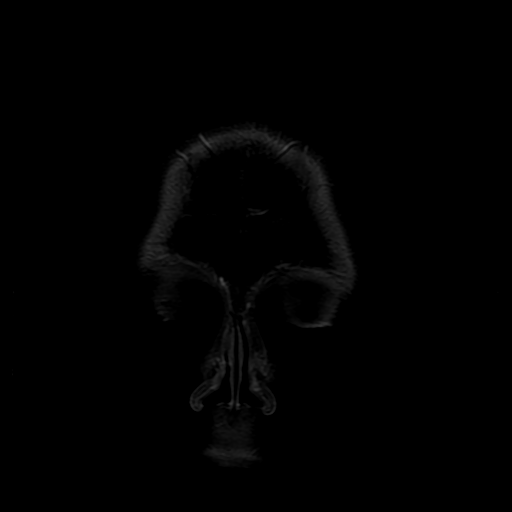
[im 27/27]
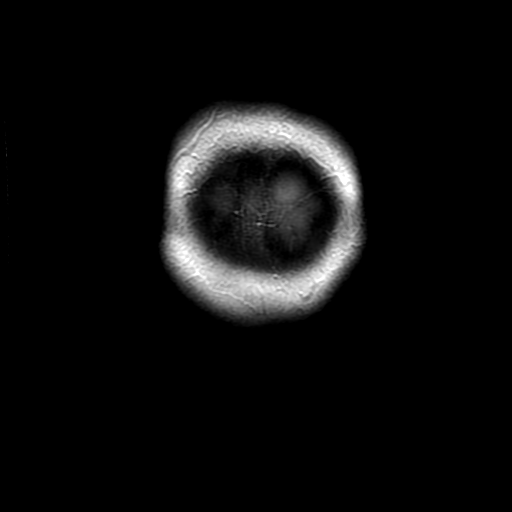

[Series 400: DWI · axial · 3.0mm · 1.09mm/px · z∈[-47,+88]mm · 4 of 46 slices shown (3 of 4)]
[im 1/46]
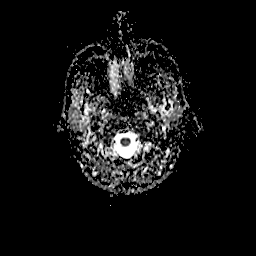
[im 16/46]
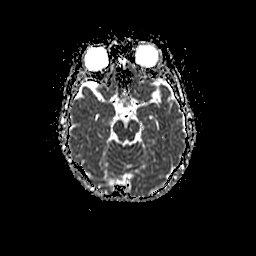
[im 31/46]
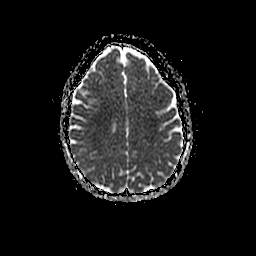
[im 46/46]
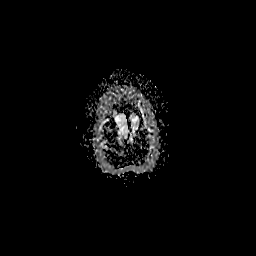

[Series 600: DWI · coronal · 5.0mm · 1.09mm/px · 3 of 33 slices shown (4 of 4)]
[im 1/33]
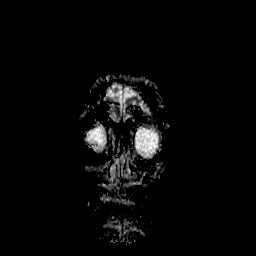
[im 17/33]
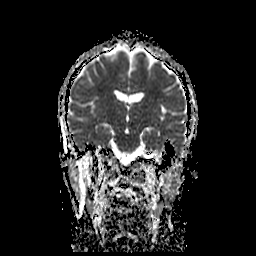
[im 33/33]
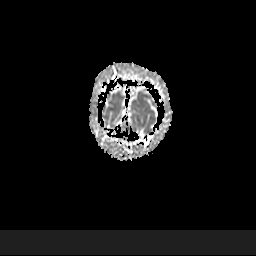

[34 of 48 positions shown; findings below may reference images not displayed]

FINDINGS: No evidence for acute infarction, hemorrhage, mass lesion,
hydrocephalus, or extra-axial fluid. Normal cerebral volume. No
white matter disease. Pituitary, pineal, and cerebellar tonsils
unremarkable. No upper cervical lesions.

Flow voids are maintained throughout the carotid, basilar, and
vertebral arteries. There are no areas of chronic hemorrhage.

Post infusion, no abnormal enhancement of the brain or meninges.

Visualized calvarium, skull base, and upper cervical osseous
structures unremarkable. Scalp and extracranial soft tissues,
orbits, sinuses, and mastoids show no acute process.

Special attention was directed to the posterior fossa on the RIGHT.
No cerebellopontine angle masses. No significant vertebral artery
dolichoectasia in the region of the seventh nerve entry zone.
Normal-appearing internal auditory canal and temporal bones.
IMPRESSION: Negative exam.

## 2019-10-27 DIAGNOSIS — R109 Unspecified abdominal pain: Secondary | ICD-10-CM | POA: Diagnosis not present

## 2019-10-27 DIAGNOSIS — Z0000 Encounter for general adult medical examination without abnormal findings: Secondary | ICD-10-CM | POA: Diagnosis not present

## 2019-10-27 DIAGNOSIS — Z23 Encounter for immunization: Secondary | ICD-10-CM | POA: Diagnosis not present

## 2019-10-27 DIAGNOSIS — D696 Thrombocytopenia, unspecified: Secondary | ICD-10-CM | POA: Diagnosis not present

## 2019-10-27 DIAGNOSIS — Z13220 Encounter for screening for lipoid disorders: Secondary | ICD-10-CM | POA: Diagnosis not present

## 2019-10-27 DIAGNOSIS — E559 Vitamin D deficiency, unspecified: Secondary | ICD-10-CM | POA: Diagnosis not present

## 2019-12-04 DIAGNOSIS — R110 Nausea: Secondary | ICD-10-CM | POA: Diagnosis not present

## 2019-12-04 DIAGNOSIS — R140 Abdominal distension (gaseous): Secondary | ICD-10-CM | POA: Diagnosis not present

## 2019-12-05 DIAGNOSIS — R110 Nausea: Secondary | ICD-10-CM | POA: Diagnosis not present

## 2019-12-05 DIAGNOSIS — R140 Abdominal distension (gaseous): Secondary | ICD-10-CM | POA: Diagnosis not present

## 2019-12-19 DIAGNOSIS — Z23 Encounter for immunization: Secondary | ICD-10-CM | POA: Diagnosis not present

## 2020-01-19 DIAGNOSIS — Z1151 Encounter for screening for human papillomavirus (HPV): Secondary | ICD-10-CM | POA: Diagnosis not present

## 2020-01-19 DIAGNOSIS — Z1231 Encounter for screening mammogram for malignant neoplasm of breast: Secondary | ICD-10-CM | POA: Diagnosis not present

## 2020-01-19 DIAGNOSIS — D069 Carcinoma in situ of cervix, unspecified: Secondary | ICD-10-CM | POA: Diagnosis not present

## 2020-01-19 DIAGNOSIS — Z01419 Encounter for gynecological examination (general) (routine) without abnormal findings: Secondary | ICD-10-CM | POA: Diagnosis not present

## 2020-01-19 DIAGNOSIS — Z6826 Body mass index (BMI) 26.0-26.9, adult: Secondary | ICD-10-CM | POA: Diagnosis not present

## 2020-03-25 DIAGNOSIS — Z20822 Contact with and (suspected) exposure to covid-19: Secondary | ICD-10-CM | POA: Diagnosis not present

## 2020-04-16 DIAGNOSIS — K6389 Other specified diseases of intestine: Secondary | ICD-10-CM | POA: Diagnosis not present

## 2020-04-16 DIAGNOSIS — R140 Abdominal distension (gaseous): Secondary | ICD-10-CM | POA: Diagnosis not present

## 2020-04-23 DIAGNOSIS — Z03818 Encounter for observation for suspected exposure to other biological agents ruled out: Secondary | ICD-10-CM | POA: Diagnosis not present

## 2020-04-23 DIAGNOSIS — Z20822 Contact with and (suspected) exposure to covid-19: Secondary | ICD-10-CM | POA: Diagnosis not present

## 2020-11-24 DIAGNOSIS — E559 Vitamin D deficiency, unspecified: Secondary | ICD-10-CM | POA: Diagnosis not present

## 2020-11-24 DIAGNOSIS — Z13220 Encounter for screening for lipoid disorders: Secondary | ICD-10-CM | POA: Diagnosis not present

## 2020-11-24 DIAGNOSIS — D696 Thrombocytopenia, unspecified: Secondary | ICD-10-CM | POA: Diagnosis not present

## 2020-11-24 DIAGNOSIS — Z0000 Encounter for general adult medical examination without abnormal findings: Secondary | ICD-10-CM | POA: Diagnosis not present

## 2020-11-24 DIAGNOSIS — Z23 Encounter for immunization: Secondary | ICD-10-CM | POA: Diagnosis not present

## 2020-12-04 DIAGNOSIS — M545 Low back pain, unspecified: Secondary | ICD-10-CM | POA: Diagnosis not present

## 2021-02-11 DIAGNOSIS — R1319 Other dysphagia: Secondary | ICD-10-CM | POA: Diagnosis not present

## 2021-03-10 DIAGNOSIS — K31A15 Gastric intestinal metaplasia without dysplasia, involving multiple sites: Secondary | ICD-10-CM | POA: Diagnosis not present

## 2021-03-10 DIAGNOSIS — K31A12 Gastric intestinal metaplasia without dysplasia, involving the body (corpus): Secondary | ICD-10-CM | POA: Diagnosis not present

## 2021-03-10 DIAGNOSIS — R1310 Dysphagia, unspecified: Secondary | ICD-10-CM | POA: Diagnosis not present

## 2021-03-10 DIAGNOSIS — K2930 Chronic superficial gastritis without bleeding: Secondary | ICD-10-CM | POA: Diagnosis not present

## 2021-03-29 DIAGNOSIS — Z01419 Encounter for gynecological examination (general) (routine) without abnormal findings: Secondary | ICD-10-CM | POA: Diagnosis not present

## 2021-03-29 DIAGNOSIS — Z6825 Body mass index (BMI) 25.0-25.9, adult: Secondary | ICD-10-CM | POA: Diagnosis not present

## 2021-03-29 DIAGNOSIS — Z1231 Encounter for screening mammogram for malignant neoplasm of breast: Secondary | ICD-10-CM | POA: Diagnosis not present

## 2021-04-01 DIAGNOSIS — N6002 Solitary cyst of left breast: Secondary | ICD-10-CM | POA: Diagnosis not present

## 2021-12-07 ENCOUNTER — Other Ambulatory Visit (HOSPITAL_BASED_OUTPATIENT_CLINIC_OR_DEPARTMENT_OTHER): Payer: Self-pay

## 2021-12-07 MED ORDER — COVID-19 MRNA 2023-2024 VACCINE (COMIRNATY) 0.3 ML INJECTION
INTRAMUSCULAR | 0 refills | Status: DC
  Filled 2021-12-07: qty 0.3, 1d supply, fill #0

## 2021-12-07 MED ORDER — INFLUENZA VAC SPLIT QUAD 0.5 ML IM SUSY
PREFILLED_SYRINGE | INTRAMUSCULAR | 0 refills | Status: DC
  Filled 2021-12-07: qty 0.5, 1d supply, fill #0

## 2022-03-02 DIAGNOSIS — E7800 Pure hypercholesterolemia, unspecified: Secondary | ICD-10-CM | POA: Diagnosis not present

## 2022-03-02 DIAGNOSIS — D696 Thrombocytopenia, unspecified: Secondary | ICD-10-CM | POA: Diagnosis not present

## 2022-03-02 DIAGNOSIS — Z0000 Encounter for general adult medical examination without abnormal findings: Secondary | ICD-10-CM | POA: Diagnosis not present

## 2022-03-02 DIAGNOSIS — E559 Vitamin D deficiency, unspecified: Secondary | ICD-10-CM | POA: Diagnosis not present

## 2022-03-02 DIAGNOSIS — Z79899 Other long term (current) drug therapy: Secondary | ICD-10-CM | POA: Diagnosis not present

## 2022-04-05 DIAGNOSIS — Z01419 Encounter for gynecological examination (general) (routine) without abnormal findings: Secondary | ICD-10-CM | POA: Diagnosis not present

## 2022-04-05 DIAGNOSIS — Z1231 Encounter for screening mammogram for malignant neoplasm of breast: Secondary | ICD-10-CM | POA: Diagnosis not present

## 2022-04-05 DIAGNOSIS — Z124 Encounter for screening for malignant neoplasm of cervix: Secondary | ICD-10-CM | POA: Diagnosis not present

## 2022-04-05 DIAGNOSIS — Z6825 Body mass index (BMI) 25.0-25.9, adult: Secondary | ICD-10-CM | POA: Diagnosis not present

## 2022-05-29 ENCOUNTER — Other Ambulatory Visit (HOSPITAL_BASED_OUTPATIENT_CLINIC_OR_DEPARTMENT_OTHER): Payer: Self-pay

## 2022-05-29 MED ORDER — VALACYCLOVIR HCL 1 G PO TABS
1000.0000 mg | ORAL_TABLET | Freq: Two times a day (BID) | ORAL | 0 refills | Status: AC | PRN
  Filled 2022-05-29 – 2022-06-08 (×2): qty 30, 15d supply, fill #0

## 2022-06-05 ENCOUNTER — Other Ambulatory Visit (HOSPITAL_BASED_OUTPATIENT_CLINIC_OR_DEPARTMENT_OTHER): Payer: Self-pay

## 2022-06-08 ENCOUNTER — Other Ambulatory Visit (HOSPITAL_BASED_OUTPATIENT_CLINIC_OR_DEPARTMENT_OTHER): Payer: Self-pay

## 2022-06-08 DIAGNOSIS — L309 Dermatitis, unspecified: Secondary | ICD-10-CM | POA: Diagnosis not present

## 2022-06-13 ENCOUNTER — Other Ambulatory Visit (HOSPITAL_BASED_OUTPATIENT_CLINIC_OR_DEPARTMENT_OTHER): Payer: Self-pay

## 2022-06-13 MED ORDER — PREDNISONE 50 MG PO TABS
50.0000 mg | ORAL_TABLET | Freq: Every day | ORAL | 0 refills | Status: DC
  Filled 2022-06-13: qty 5, 5d supply, fill #0

## 2022-06-14 ENCOUNTER — Other Ambulatory Visit (HOSPITAL_BASED_OUTPATIENT_CLINIC_OR_DEPARTMENT_OTHER): Payer: Self-pay

## 2022-07-26 ENCOUNTER — Other Ambulatory Visit (HOSPITAL_BASED_OUTPATIENT_CLINIC_OR_DEPARTMENT_OTHER): Payer: Self-pay

## 2022-07-26 DIAGNOSIS — L309 Dermatitis, unspecified: Secondary | ICD-10-CM | POA: Diagnosis not present

## 2022-07-26 MED ORDER — TRIAMCINOLONE ACETONIDE 0.1 % EX CREA
TOPICAL_CREAM | CUTANEOUS | 1 refills | Status: DC
  Filled 2022-07-26: qty 453.6, 30d supply, fill #0

## 2022-07-26 MED ORDER — PREDNISONE 20 MG PO TABS
ORAL_TABLET | ORAL | 0 refills | Status: DC
  Filled 2022-07-26: qty 30, 15d supply, fill #0

## 2022-08-09 DIAGNOSIS — L309 Dermatitis, unspecified: Secondary | ICD-10-CM | POA: Diagnosis not present

## 2022-09-13 ENCOUNTER — Other Ambulatory Visit (HOSPITAL_BASED_OUTPATIENT_CLINIC_OR_DEPARTMENT_OTHER): Payer: Self-pay

## 2022-09-13 DIAGNOSIS — L2389 Allergic contact dermatitis due to other agents: Secondary | ICD-10-CM | POA: Diagnosis not present

## 2022-09-13 MED ORDER — PREDNISONE 20 MG PO TABS
ORAL_TABLET | ORAL | 0 refills | Status: AC
  Filled 2022-09-13: qty 30, 15d supply, fill #0

## 2022-10-16 DIAGNOSIS — D225 Melanocytic nevi of trunk: Secondary | ICD-10-CM | POA: Diagnosis not present

## 2022-10-16 DIAGNOSIS — L814 Other melanin hyperpigmentation: Secondary | ICD-10-CM | POA: Diagnosis not present

## 2022-11-22 DIAGNOSIS — G4486 Cervicogenic headache: Secondary | ICD-10-CM | POA: Diagnosis not present

## 2022-11-22 DIAGNOSIS — M542 Cervicalgia: Secondary | ICD-10-CM | POA: Diagnosis not present

## 2022-11-24 ENCOUNTER — Other Ambulatory Visit (HOSPITAL_BASED_OUTPATIENT_CLINIC_OR_DEPARTMENT_OTHER): Payer: Self-pay

## 2022-11-24 MED ORDER — FLULAVAL 0.5 ML IM SUSY
0.5000 mL | PREFILLED_SYRINGE | Freq: Once | INTRAMUSCULAR | 0 refills | Status: AC
  Filled 2022-11-24: qty 0.5, 1d supply, fill #0

## 2022-11-24 MED ORDER — COMIRNATY 30 MCG/0.3ML IM SUSY
0.3000 mL | PREFILLED_SYRINGE | Freq: Once | INTRAMUSCULAR | 0 refills | Status: AC
  Filled 2022-11-24: qty 0.3, 1d supply, fill #0

## 2022-11-28 DIAGNOSIS — M542 Cervicalgia: Secondary | ICD-10-CM | POA: Diagnosis not present

## 2022-11-28 DIAGNOSIS — G4486 Cervicogenic headache: Secondary | ICD-10-CM | POA: Diagnosis not present

## 2022-11-30 DIAGNOSIS — G4486 Cervicogenic headache: Secondary | ICD-10-CM | POA: Diagnosis not present

## 2022-11-30 DIAGNOSIS — M542 Cervicalgia: Secondary | ICD-10-CM | POA: Diagnosis not present

## 2022-12-05 DIAGNOSIS — M542 Cervicalgia: Secondary | ICD-10-CM | POA: Diagnosis not present

## 2022-12-05 DIAGNOSIS — G4486 Cervicogenic headache: Secondary | ICD-10-CM | POA: Diagnosis not present

## 2022-12-07 DIAGNOSIS — M542 Cervicalgia: Secondary | ICD-10-CM | POA: Diagnosis not present

## 2022-12-07 DIAGNOSIS — G4486 Cervicogenic headache: Secondary | ICD-10-CM | POA: Diagnosis not present

## 2022-12-12 DIAGNOSIS — M542 Cervicalgia: Secondary | ICD-10-CM | POA: Diagnosis not present

## 2022-12-12 DIAGNOSIS — G4486 Cervicogenic headache: Secondary | ICD-10-CM | POA: Diagnosis not present

## 2022-12-19 DIAGNOSIS — M542 Cervicalgia: Secondary | ICD-10-CM | POA: Diagnosis not present

## 2022-12-19 DIAGNOSIS — G4486 Cervicogenic headache: Secondary | ICD-10-CM | POA: Diagnosis not present

## 2022-12-21 DIAGNOSIS — M542 Cervicalgia: Secondary | ICD-10-CM | POA: Diagnosis not present

## 2022-12-21 DIAGNOSIS — G4486 Cervicogenic headache: Secondary | ICD-10-CM | POA: Diagnosis not present

## 2022-12-28 DIAGNOSIS — M542 Cervicalgia: Secondary | ICD-10-CM | POA: Diagnosis not present

## 2022-12-28 DIAGNOSIS — G4486 Cervicogenic headache: Secondary | ICD-10-CM | POA: Diagnosis not present

## 2023-01-03 DIAGNOSIS — G4486 Cervicogenic headache: Secondary | ICD-10-CM | POA: Diagnosis not present

## 2023-01-03 DIAGNOSIS — M542 Cervicalgia: Secondary | ICD-10-CM | POA: Diagnosis not present

## 2023-01-12 DIAGNOSIS — M542 Cervicalgia: Secondary | ICD-10-CM | POA: Diagnosis not present

## 2023-01-12 DIAGNOSIS — G4486 Cervicogenic headache: Secondary | ICD-10-CM | POA: Diagnosis not present

## 2023-01-18 DIAGNOSIS — M542 Cervicalgia: Secondary | ICD-10-CM | POA: Diagnosis not present

## 2023-01-18 DIAGNOSIS — G4486 Cervicogenic headache: Secondary | ICD-10-CM | POA: Diagnosis not present

## 2023-01-23 DIAGNOSIS — M542 Cervicalgia: Secondary | ICD-10-CM | POA: Diagnosis not present

## 2023-01-23 DIAGNOSIS — G4486 Cervicogenic headache: Secondary | ICD-10-CM | POA: Diagnosis not present

## 2023-01-30 ENCOUNTER — Other Ambulatory Visit (HOSPITAL_BASED_OUTPATIENT_CLINIC_OR_DEPARTMENT_OTHER): Payer: Self-pay

## 2023-01-30 DIAGNOSIS — L309 Dermatitis, unspecified: Secondary | ICD-10-CM | POA: Diagnosis not present

## 2023-01-30 MED ORDER — PREDNISONE 20 MG PO TABS
ORAL_TABLET | ORAL | 0 refills | Status: AC
  Filled 2023-01-30: qty 30, 15d supply, fill #0

## 2023-01-31 DIAGNOSIS — G4486 Cervicogenic headache: Secondary | ICD-10-CM | POA: Diagnosis not present

## 2023-01-31 DIAGNOSIS — M542 Cervicalgia: Secondary | ICD-10-CM | POA: Diagnosis not present

## 2023-02-06 DIAGNOSIS — M542 Cervicalgia: Secondary | ICD-10-CM | POA: Diagnosis not present

## 2023-02-06 DIAGNOSIS — G4486 Cervicogenic headache: Secondary | ICD-10-CM | POA: Diagnosis not present

## 2023-02-13 DIAGNOSIS — L309 Dermatitis, unspecified: Secondary | ICD-10-CM | POA: Diagnosis not present

## 2023-02-13 DIAGNOSIS — M542 Cervicalgia: Secondary | ICD-10-CM | POA: Diagnosis not present

## 2023-03-06 DIAGNOSIS — G4486 Cervicogenic headache: Secondary | ICD-10-CM | POA: Diagnosis not present

## 2023-03-06 DIAGNOSIS — M542 Cervicalgia: Secondary | ICD-10-CM | POA: Diagnosis not present

## 2023-03-09 DIAGNOSIS — E7800 Pure hypercholesterolemia, unspecified: Secondary | ICD-10-CM | POA: Diagnosis not present

## 2023-03-09 DIAGNOSIS — L508 Other urticaria: Secondary | ICD-10-CM | POA: Diagnosis not present

## 2023-03-09 DIAGNOSIS — E559 Vitamin D deficiency, unspecified: Secondary | ICD-10-CM | POA: Diagnosis not present

## 2023-03-09 DIAGNOSIS — Z0000 Encounter for general adult medical examination without abnormal findings: Secondary | ICD-10-CM | POA: Diagnosis not present

## 2023-03-09 DIAGNOSIS — D696 Thrombocytopenia, unspecified: Secondary | ICD-10-CM | POA: Diagnosis not present

## 2023-03-15 DIAGNOSIS — Z1211 Encounter for screening for malignant neoplasm of colon: Secondary | ICD-10-CM | POA: Diagnosis not present

## 2023-03-19 ENCOUNTER — Other Ambulatory Visit (HOSPITAL_BASED_OUTPATIENT_CLINIC_OR_DEPARTMENT_OTHER): Payer: Self-pay

## 2023-03-19 DIAGNOSIS — M542 Cervicalgia: Secondary | ICD-10-CM | POA: Diagnosis not present

## 2023-03-19 MED ORDER — BACLOFEN 10 MG PO TABS
10.0000 mg | ORAL_TABLET | Freq: Two times a day (BID) | ORAL | 0 refills | Status: DC | PRN
  Filled 2023-03-19: qty 60, 30d supply, fill #0

## 2023-03-19 MED ORDER — DICLOFENAC SODIUM 75 MG PO TBEC
75.0000 mg | DELAYED_RELEASE_TABLET | Freq: Two times a day (BID) | ORAL | 0 refills | Status: DC | PRN
  Filled 2023-03-19: qty 60, 30d supply, fill #0

## 2023-03-20 ENCOUNTER — Other Ambulatory Visit (HOSPITAL_BASED_OUTPATIENT_CLINIC_OR_DEPARTMENT_OTHER): Payer: Self-pay

## 2023-03-20 MED ORDER — VALACYCLOVIR HCL 1 G PO TABS
1000.0000 mg | ORAL_TABLET | Freq: Two times a day (BID) | ORAL | 0 refills | Status: DC
  Filled 2023-03-20 – 2023-03-30 (×2): qty 30, 15d supply, fill #0

## 2023-03-21 ENCOUNTER — Ambulatory Visit
Admission: RE | Admit: 2023-03-21 | Discharge: 2023-03-21 | Disposition: A | Discharge status: Home / Self Care | Payer: BC Managed Care – PPO | Source: Ambulatory Visit | Attending: Nurse Practitioner | Admitting: Nurse Practitioner

## 2023-03-21 ENCOUNTER — Other Ambulatory Visit: Payer: Self-pay | Admitting: Nurse Practitioner

## 2023-03-21 DIAGNOSIS — M542 Cervicalgia: Secondary | ICD-10-CM

## 2023-03-25 ENCOUNTER — Other Ambulatory Visit: Payer: Self-pay

## 2023-03-25 ENCOUNTER — Emergency Department (HOSPITAL_BASED_OUTPATIENT_CLINIC_OR_DEPARTMENT_OTHER): Payer: BC Managed Care – PPO

## 2023-03-25 ENCOUNTER — Emergency Department (HOSPITAL_BASED_OUTPATIENT_CLINIC_OR_DEPARTMENT_OTHER)
Admission: EM | Admit: 2023-03-25 | Discharge: 2023-03-25 | Disposition: A | Discharge status: Home / Self Care | Payer: BC Managed Care – PPO | Attending: Emergency Medicine | Admitting: Emergency Medicine

## 2023-03-25 ENCOUNTER — Encounter (HOSPITAL_BASED_OUTPATIENT_CLINIC_OR_DEPARTMENT_OTHER): Payer: Self-pay

## 2023-03-25 DIAGNOSIS — L539 Erythematous condition, unspecified: Secondary | ICD-10-CM | POA: Diagnosis not present

## 2023-03-25 DIAGNOSIS — M79661 Pain in right lower leg: Secondary | ICD-10-CM | POA: Insufficient documentation

## 2023-03-25 DIAGNOSIS — M7989 Other specified soft tissue disorders: Secondary | ICD-10-CM | POA: Diagnosis not present

## 2023-03-25 DIAGNOSIS — M79604 Pain in right leg: Secondary | ICD-10-CM | POA: Diagnosis not present

## 2023-03-25 DIAGNOSIS — L03115 Cellulitis of right lower limb: Secondary | ICD-10-CM | POA: Diagnosis not present

## 2023-03-25 MED ORDER — CEPHALEXIN 500 MG PO CAPS: 500.0000 mg | ORAL_CAPSULE | Freq: Two times a day (BID) | ORAL | 0 refills | Status: AC

## 2023-03-25 NOTE — Discharge Instructions (Addendum)
Your history, your history, exam, workup today did not show evidence of DVT on the ultrasound.  Clinically I suspect a Baker's cyst that ruptured and we see the fluid and edema in the leg subsequently.  Given the possibility of early infection or cellulitis, we agreed to give prescription for antibiotics to take if you have worsening redness pain or see skin changes involved.  Please rest and stay hydrated and follow-up with your primary doctor.  If any symptoms change or worsen acutely, return to the nearest emergency department.

## 2023-03-25 NOTE — ED Triage Notes (Signed)
In for eval of right leg pain and swelling. Onset Friday.

## 2023-03-25 NOTE — ED Provider Notes (Signed)
[place] EMERGENCY DEPARTMENT AT [facility] Provider Note   CSN: [id number] Arrival date & time: 03/25/23  7829     History  Chief Complaint  Patient presents with   Leg Swelling    Amber Terrell is a 48 y.o. female.  The history is provided by the patient and medical records. No language interpreter was used.  Leg Pain Location:  Leg Time since incident:  3 days Injury: no   Leg location:  R lower leg Pain details:    Quality:  Aching   Radiates to:  Does not radiate   Severity:  Mild   Onset quality:  Gradual   Timing:  Constant   Progression:  Unchanged Chronicity:  New Tetanus status:  Unknown Prior injury to area:  No Relieved by:  Nothing Worsened by:  Nothing Ineffective treatments:  None tried Associated symptoms: swelling   Associated symptoms: no back pain, no fatigue, no fever, no neck pain and no numbness        Home Medications Prior to Admission medications   Medication Sig Start Date End Date Taking? Authorizing Provider  Multiple Vitamin (MULTIVITAMIN) tablet Take 1 tablet by mouth daily.   Yes [provider]  baclofen (LIORESAL) 10 MG tablet Take 1 tablet (10 mg total) by mouth 2 (two) times daily as needed. 03/19/23     COVID-19 mRNA vaccine 2023-2024 (COMIRNATY) SUSP injection Inject into the muscle. 12/07/21   Judyann Munson, MD  diclofenac (VOLTAREN) 75 MG EC tablet Take 1 tablet (75 mg total) by mouth 2 (two) times daily as needed. 03/19/23     influenza vac split quadrivalent PF (FLUARIX) 0.5 ML injection Inject into the muscle. 12/07/21   Judyann Munson, MD  triamcinolone cream (KENALOG) 0.1 % Apply to affected areas twice a day for 4 weeks as needed for flares. Not safe for long term use 07/26/22     valACYclovir (VALTREX) 1000 MG tablet Take 1 tablet (1,000 mg total) by mouth 2 (two) times daily for 2 days as needed for cold sore outbreaks. 05/29/22     valACYclovir (VALTREX) 1000 MG tablet Take 1 tablet (1,000 mg total)  by mouth 2 (two) times daily as needed. 03/20/23         Allergies    Penicillins    Review of Systems   Review of Systems  Constitutional:  Negative for chills, fatigue and fever.  HENT:  Negative for congestion.   Respiratory:  Negative for cough, chest tightness, shortness of breath and wheezing.   Cardiovascular:  Positive for leg swelling. Negative for chest pain and palpitations.  Gastrointestinal:  Negative for abdominal pain, constipation, diarrhea, nausea and vomiting.  Genitourinary:  Negative for dysuria and flank pain.  Musculoskeletal:  Negative for back pain, neck pain and neck stiffness.  Skin:  Negative for rash and wound.  Neurological:  Negative for headaches.  Psychiatric/Behavioral:  Negative for agitation.   All other systems reviewed and are negative.   Physical Exam Updated Vital Signs BP 125/85 (BP Location: Left Arm)   Pulse 71   Temp 98.9 F (37.2 C) (Oral)   Resp 16   Ht 5\' 6"  (1.676 m)   Wt 77.1 kg   SpO2 100%   BMI 27.44 kg/m  Physical Exam Vitals and nursing note reviewed.  Constitutional:      General: She is not in acute distress.    Appearance: She is well-developed. She is not ill-appearing, toxic-appearing or diaphoretic.  HENT:     Head:  Normocephalic and atraumatic.     Mouth/Throat:     Mouth: Mucous membranes are moist.  Eyes:     Conjunctiva/sclera: Conjunctivae normal.  Cardiovascular:     Rate and Rhythm: Normal rate and regular rhythm.     Heart sounds: No murmur heard. Pulmonary:     Effort: Pulmonary effort is normal. No respiratory distress.     Breath sounds: Normal breath sounds. No wheezing, rhonchi or rales.  Chest:     Chest wall: No tenderness.  Abdominal:     General: Abdomen is flat.     Palpations: Abdomen is soft.     Tenderness: There is no abdominal tenderness.  Musculoskeletal:        General: Swelling and tenderness present.     Cervical back: Neck supple.     Right lower leg: Edema present.      Left lower leg: No edema.  Skin:    General: Skin is warm and dry.     Capillary Refill: Capillary refill takes less than 2 seconds.     Findings: Erythema present. No rash.  Neurological:     General: No focal deficit present.     Mental Status: She is alert.     Sensory: No sensory deficit.     Motor: No weakness.  Psychiatric:        Mood and Affect: Mood normal.     ED Results / Procedures / Treatments   Labs (all labs ordered are listed, but only abnormal results are displayed) Labs Reviewed - No data to display  EKG None  Radiology US Venous Img Lower Unilateral Right Result Date: 03/25/2023 CLINICAL DATA:  Posterior pain x2 days EXAM: RIGHT LOWER EXTREMITY VENOUS DOPPLER ULTRASOUND TECHNIQUE: Gray-scale sonography with compression, as well as color and duplex ultrasound, were performed to evaluate the deep venous system(s) from the level of the common femoral vein through the popliteal and proximal calf veins. COMPARISON:  None available FINDINGS: VENOUS Normal compressibility of the common femoral, superficial femoral, and popliteal veins, as well as the visualized calf veins. Visualized portions of profunda femoral vein and great saphenous vein unremarkable. No filling defects to suggest DVT on grayscale or color Doppler imaging. Doppler waveforms show normal direction of venous flow, normal respiratory plasticity and response to augmentation. Limited views of the contralateral common femoral vein are unremarkable. OTHER No Baker's cyst demonstrated. Limitations: none IMPRESSION: Negative. Electronically Signed   By: Corlis Leak M.D.   On: 03/25/2023 10:24    Procedures Procedures    Medications Ordered in ED Medications - No data to display  ED Course/ Medical Decision Making/ A&P                                 Medical Decision Making  Amber Terrell is a 48 y.o. female with no significant past medical history who presents with right leg pain and swelling.  According  to patient, she recently flew to [place] 3 weeks ago but over the last 3 days has noticed swelling redness and pain in her right lower leg.  She also has pain behind her knee at the popliteal fossa area and upper calf.  She denies any history of blood clots in her legs or lungs.  Denies any wounds or rashes or skin injuries that could be a source of infection.  Denies history of Baker's cyst.  Denies trauma.  Reports no fevers, chills, congestion, cough, nausea,  vomiting, constipation, diarrhea, or urinary changes.  No other complaints reported.  On exam, patient has some swelling and erythema to her right lower leg from the knee down and left side does not have this.  Some tenderness in the popliteal area and upper calf.  Intact sensation strength and pulse.  Hip nontender.  Rest of exam unremarkable.  Will get ultrasound to rule out DVT versus Baker's cyst versus other cause.  Anticipate reassessment after workup to determine disposition.           10:49 AM Ultrasound returned without evidence of DVT or convincing Baker's cyst at this time.  Clinically I suspect a Baker's cyst ruptured and we are seeing the fluid from it and the location of her discomfort.  We had a shared decision-making conversation and she is reassured based on the reassuring ultrasound however given the redness and swelling and the possibility of an early cellulitis, we will plan prescription for antibiotics to take if symptoms were to evolve, change, or worsen.  Patient agrees to this plan.  Patient we discharged for outpatient follow-up and understood return precautions.         Final Clinical Impression(s) / ED Diagnoses Final diagnoses:  Right leg pain  Right leg swelling  Leg erythema    Rx / DC Orders ED Discharge Orders          Ordered    cephALEXin (KEFLEX) 500 MG capsule  2 times daily        03/25/23 1051           Clinical Impression: 1. Right leg pain   2. Right leg swelling   3. Leg  erythema     Disposition: Discharge  Condition: Good  I have discussed the results, Dx and Tx plan with the pt(& family if present). He/she/they expressed understanding and agree(s) with the plan. Discharge instructions discussed at great length. Strict return precautions discussed and pt &/or family have verbalized understanding of the instructions. No further questions at time of discharge.    New Prescriptions   CEPHALEXIN (KEFLEX) 500 MG CAPSULE    Take 1 capsule (500 mg total) by mouth 2 (two) times daily for 7 days.    Follow Up: Cain Saupe, MD [address], [address] [place] [address] [phone]     [facility] Emergency Department at [facility] [address] [place] [id number] [phone]       Lendora Keys, Canary Brim, MD 03/25/23 1052

## 2023-03-28 DIAGNOSIS — M542 Cervicalgia: Secondary | ICD-10-CM | POA: Diagnosis not present

## 2023-03-29 ENCOUNTER — Other Ambulatory Visit (HOSPITAL_BASED_OUTPATIENT_CLINIC_OR_DEPARTMENT_OTHER): Payer: Self-pay

## 2023-03-30 ENCOUNTER — Other Ambulatory Visit (HOSPITAL_BASED_OUTPATIENT_CLINIC_OR_DEPARTMENT_OTHER): Payer: Self-pay

## 2023-04-09 DIAGNOSIS — Z1331 Encounter for screening for depression: Secondary | ICD-10-CM | POA: Diagnosis not present

## 2023-04-09 DIAGNOSIS — Z01419 Encounter for gynecological examination (general) (routine) without abnormal findings: Secondary | ICD-10-CM | POA: Diagnosis not present

## 2023-04-09 DIAGNOSIS — Z1231 Encounter for screening mammogram for malignant neoplasm of breast: Secondary | ICD-10-CM | POA: Diagnosis not present

## 2023-04-17 DIAGNOSIS — M542 Cervicalgia: Secondary | ICD-10-CM | POA: Diagnosis not present

## 2023-05-08 DIAGNOSIS — M542 Cervicalgia: Secondary | ICD-10-CM | POA: Diagnosis not present

## 2023-05-11 DIAGNOSIS — M542 Cervicalgia: Secondary | ICD-10-CM | POA: Diagnosis not present

## 2023-05-30 DIAGNOSIS — M542 Cervicalgia: Secondary | ICD-10-CM | POA: Diagnosis not present

## 2023-06-04 DIAGNOSIS — M542 Cervicalgia: Secondary | ICD-10-CM | POA: Diagnosis not present

## 2023-06-21 DIAGNOSIS — M542 Cervicalgia: Secondary | ICD-10-CM | POA: Diagnosis not present

## 2023-06-25 DIAGNOSIS — M542 Cervicalgia: Secondary | ICD-10-CM | POA: Diagnosis not present

## 2023-07-04 DIAGNOSIS — M542 Cervicalgia: Secondary | ICD-10-CM | POA: Diagnosis not present

## 2023-07-09 DIAGNOSIS — M542 Cervicalgia: Secondary | ICD-10-CM | POA: Diagnosis not present

## 2023-07-16 DIAGNOSIS — M542 Cervicalgia: Secondary | ICD-10-CM | POA: Diagnosis not present

## 2023-07-24 DIAGNOSIS — M542 Cervicalgia: Secondary | ICD-10-CM | POA: Diagnosis not present

## 2023-07-27 ENCOUNTER — Ambulatory Visit (HOSPITAL_COMMUNITY)
Admission: RE | Admit: 2023-07-27 | Discharge: 2023-07-27 | Disposition: A | Discharge status: Home / Self Care | Source: Ambulatory Visit | Attending: Vascular Surgery | Admitting: Vascular Surgery

## 2023-07-27 ENCOUNTER — Other Ambulatory Visit (HOSPITAL_COMMUNITY): Payer: Self-pay | Admitting: Family Medicine

## 2023-07-27 DIAGNOSIS — M7989 Other specified soft tissue disorders: Secondary | ICD-10-CM

## 2023-07-27 DIAGNOSIS — M79604 Pain in right leg: Secondary | ICD-10-CM | POA: Diagnosis not present

## 2023-07-30 DIAGNOSIS — M542 Cervicalgia: Secondary | ICD-10-CM | POA: Diagnosis not present

## 2023-08-06 DIAGNOSIS — M542 Cervicalgia: Secondary | ICD-10-CM | POA: Diagnosis not present

## 2023-08-14 DIAGNOSIS — M542 Cervicalgia: Secondary | ICD-10-CM | POA: Diagnosis not present

## 2023-08-24 DIAGNOSIS — M542 Cervicalgia: Secondary | ICD-10-CM | POA: Diagnosis not present

## 2023-09-26 ENCOUNTER — Other Ambulatory Visit: Payer: Self-pay

## 2023-09-26 DIAGNOSIS — R609 Edema, unspecified: Secondary | ICD-10-CM

## 2023-09-28 DIAGNOSIS — M542 Cervicalgia: Secondary | ICD-10-CM | POA: Diagnosis not present

## 2023-10-22 ENCOUNTER — Ambulatory Visit (HOSPITAL_COMMUNITY)
Admission: RE | Admit: 2023-10-22 | Discharge: 2023-10-22 | Disposition: A | Discharge status: Home / Self Care | Source: Ambulatory Visit | Attending: Surgery | Admitting: Surgery

## 2023-10-22 DIAGNOSIS — R609 Edema, unspecified: Secondary | ICD-10-CM | POA: Diagnosis not present

## 2023-10-22 NOTE — Progress Notes (Unsigned)
 Requested by:  Alec House, MD [address], [address] [place],  [place] [address]  Reason for consultation: RLE edema and discoloration    History of Present Illness   Amber Terrell is a 48 y.o. (February 22, 1976) female who presents for evaluation of right leg pain and swelling. She had developed pain back in January in her calf suddenly after walking up a couple stairs into her house. She says it felt like a calf cramp/ pull. Initially very hard to bear any weight on her right leg. This lasted bout 4 days. She was seen in ER at the time and evaluated for DVT. Ultrasound was negative. ER Physician suspected possible ruptured Baker's Cyst. She was given prescription for Antibiotics for concerns of possible early cellulitis. She had traveled to [place] 3 weeks prior to this incident. She has not had any continued pain in the right leg, however she has noticed an area on the proximal front of her thigh and also medial right ankle with discoloration. She also has swelling in her right ankle and foot ever since. She has also noticed some purplish/ blue discoloration of her toes. This increases as day goes on but is improved overnight. She does not have any symptoms on her LLE. She does wear knee high compressions stockings intermittently. No history of DVT. No family history of DVT or venous disease.    Venous symptoms include: swelling, discoloration Onset/duration:  January 2025  Occupation:  ER RN Aggravating factors: sitting, standing Alleviating factors: none Compression:  yes Helps:  no Pain medications:  no Previous vein procedures:  no History of DVT:  no  Past Medical History:  Diagnosis Date   Cancer (HCC)    precancerous cervical cells... pt had LEEP   No pertinent past medical history    Postpartum care following vaginal delivery (2/8) 04/06/2013   Routine postpartum follow-up 10/21/2010   Vaginal Pap smear, abnormal     Past Surgical History:  Procedure Laterality Date    LEEP  01/13/2010   WISDOM TOOTH EXTRACTION      Social History   Socioeconomic History   Marital status: Married    Spouse name: Not on file   Number of children: Not on file   Years of education: Not on file   Highest education level: Not on file  Occupational History   Occupation: ER nurse    Comment: part time  Tobacco Use   Smoking status: Never   Smokeless tobacco: Not on file  Vaping Use   Vaping status: Never Used  Substance and Sexual Activity   Alcohol use: Yes    Alcohol/week: 0.0 standard drinks of alcohol    Comment: once a month   Drug use: No   Sexual activity: Yes  Other Topics Concern   Not on file  Social History Narrative   Not on file   Social Drivers of Health   Financial Resource Strain: Not on file  Food Insecurity: Not on file  Transportation Needs: Not on file  Physical Activity: Not on file  Stress: Not on file  Social Connections: Unknown (07/12/2021)   Received from [facility]   Social Network    Social Network: Not on file  Intimate Partner Violence: Unknown (06/03/2021)   Received from [facility]   HITS    Physically Hurt: Not on file    Insult or Talk Down To: Not on file    Threaten Physical Harm: Not on file    Scream or Curse: Not  on file    Family History  Problem Relation Age of Onset   Hyperlipidemia Mother    Seizures Father    Other Father        benign brain tumor   Stroke Father    Hyperlipidemia Brother    Hyperlipidemia Maternal Uncle    Alcohol abuse Maternal Uncle    Hyperlipidemia Maternal Grandmother    Heart disease Maternal Grandfather     Current Outpatient Medications  Medication Sig Dispense Refill   valACYclovir  (VALTREX ) 1000 MG tablet Take 1 tablet (1,000 mg total) by mouth 2 (two) times daily for 2 days as needed for cold sore outbreaks. 30 tablet 0   valACYclovir  (VALTREX ) 1000 MG tablet Take 1 tablet (1,000 mg total) by mouth 2 (two) times daily as needed. 30 tablet 0   No current  facility-administered medications for this visit.    Allergies  Allergen Reactions   Penicillins Rash    REVIEW OF SYSTEMS (negative unless checked):   Cardiac:  []  Chest pain or chest pressure? []  Shortness of breath upon activity? []  Shortness of breath when lying flat? []  Irregular heart rhythm?  Vascular:  []  Pain in calf, thigh, or hip brought on by walking? []  Pain in feet at night that wakes you up from your sleep? []  Blood clot in your veins? [x]  Leg swelling?  Pulmonary:  []  Oxygen at home? []  Productive cough? []  Wheezing?  Neurologic:  []  Sudden weakness in arms or legs? []  Sudden numbness in arms or legs? []  Sudden onset of difficult speaking or slurred speech? []  Temporary loss of vision in one eye? []  Problems with dizziness?  Gastrointestinal:  []  Blood in stool? []  Vomited blood?  Genitourinary:  []  Burning when urinating? []  Blood in urine?  Psychiatric:  []  Major depression  Hematologic:  []  Bleeding problems? []  Problems with blood clotting?  Dermatologic:  []  Rashes or ulcers?  Constitutional:  []  Fever or chills?  Ear/Nose/Throat:  []  Change in hearing? []  Nose bleeds? []  Sore throat?  Musculoskeletal:  []  Back pain? []  Joint pain? []  Muscle pain?   Physical Examination     Vitals:   10/23/23 0833  BP: 114/84  Pulse: 67  Resp: 18  Temp: (!) 97.4 F (36.3 C)  TempSrc: Temporal  Weight: 166 lb 14.4 oz (75.7 kg)  Height: 5' 6 (1.676 m)   Body mass index is 26.94 kg/m.  General:  WDWN in NAD; vital signs documented above Gait: Normal HENT: WNL, normocephalic Pulmonary: normal non-labored breathing Cardiac: regular HR Skin: mildly Erythematous area on right anterior proximal thigh about size of orange; non tender, no signs of infection; no palpable or visible veins in this area Vascular Exam/Pulses:2+ palpable DP pulses bilaterally, feet warm and well perfused Extremities: without varicose veins, with reticular  veins medial right ankle, without edema, with very mild stasis pigmentation changes around right ankle, without lipodermatosclerosis, without ulcers Musculoskeletal: no muscle wasting or atrophy  Neurologic: A&O X 3;  No focal weakness or paresthesias are detected Psychiatric:  The pt has Normal affect.  Non-invasive Vascular Imaging   BLE Venous Insufficiency Duplex (10/22/23):  RLE:  No DVT and SVT GSV reflux SFJ GSV diameter 0.25- 0.52 cm No SSV reflux  CFV deep venous reflux Perforator in calf with reflux  Medical Decision Making   Amber Terrell is a 48 y.o. female who presents with: RLE chronic venous insufficiency with swelling and discoloration. On exam she has some small spider veins broken around medial right  ankle. No evidence of any SVT or thrombophlebitis. No large varicosities. Mild hyperpigmentation around right ankle. The skin discoloration on right thigh I do not think is relater to her venous insufficiency. Duplex today shows no DVT or SVT. She does have a little incompetence in the CFV and at the [facility]. The rest of the GSV and SSV are both competent. Perforator in calf noted to have reflux. Based on this study she would not be a candidate for venous ablation.  Based on the patient's history and examination, I recommend: daily elevation of 20-30 minutes above level of heart, daily compression stocking use, exercise, weight reduction, refraining from prolonged sitting or standing. I discussed with the patient the use of her 15-20 mm knee high compression stockings. She was measured and fitted for a pair at today's visit Patient was provided with information about vein health She may benefit from evaluation by Dermatologist regarding skin changes on right thigh She can follow up as needed if any new or worsening symptoms   Amber Damme, PA-C [facility] Office: [phone]  10/23/2023, 9:05 AM  Clinic MD:  Tomie Gaskins

## 2023-10-23 ENCOUNTER — Ambulatory Visit: Attending: Vascular Surgery | Admitting: Physician Assistant

## 2023-10-23 VITALS — BP 114/84 | HR 67 | Temp 97.4°F | Resp 18 | Ht 66.0 in | Wt 166.9 lb

## 2023-10-23 DIAGNOSIS — I872 Venous insufficiency (chronic) (peripheral): Secondary | ICD-10-CM

## 2023-10-23 DIAGNOSIS — M7989 Other specified soft tissue disorders: Secondary | ICD-10-CM

## 2023-10-23 DIAGNOSIS — R609 Edema, unspecified: Secondary | ICD-10-CM | POA: Diagnosis not present

## 2023-10-26 DIAGNOSIS — M542 Cervicalgia: Secondary | ICD-10-CM | POA: Diagnosis not present

## 2023-11-08 DIAGNOSIS — M542 Cervicalgia: Secondary | ICD-10-CM | POA: Diagnosis not present

## 2023-11-19 DIAGNOSIS — M542 Cervicalgia: Secondary | ICD-10-CM | POA: Diagnosis not present

## 2023-11-28 ENCOUNTER — Telehealth: Payer: Self-pay

## 2023-11-28 DIAGNOSIS — L819 Disorder of pigmentation, unspecified: Secondary | ICD-10-CM | POA: Diagnosis not present

## 2023-11-28 DIAGNOSIS — R0989 Other specified symptoms and signs involving the circulatory and respiratory systems: Secondary | ICD-10-CM | POA: Diagnosis not present

## 2023-11-28 NOTE — Telephone Encounter (Signed)
 Patient called asking to see one of the vascular surgeons because her PCP had some concerns about the results from her Venous duplex done on 10/22/23.  Provider is concerned about pt's perforator vein in the right mid calf and the right femoral vein.  Pt also reported her PCP will also be making a referral for an ABI.  Consulted Sonya Rankin, RN and she advised that [facility] does not treat perforator or femoral vein issues.   Pt is aware that we do not treat the above listed issues and will relay the information back to her PCP.  Advised pt that once the ABI referral is received, [facility] will schedule that and an appt with a provider.

## 2023-12-03 DIAGNOSIS — M542 Cervicalgia: Secondary | ICD-10-CM | POA: Diagnosis not present

## 2023-12-17 ENCOUNTER — Other Ambulatory Visit: Payer: Self-pay

## 2023-12-17 DIAGNOSIS — I739 Peripheral vascular disease, unspecified: Secondary | ICD-10-CM

## 2023-12-18 ENCOUNTER — Other Ambulatory Visit (HOSPITAL_BASED_OUTPATIENT_CLINIC_OR_DEPARTMENT_OTHER): Payer: Self-pay

## 2023-12-18 MED ORDER — FLUZONE 0.5 ML IM SUSY
0.5000 mL | PREFILLED_SYRINGE | Freq: Once | INTRAMUSCULAR | 0 refills | Status: AC
  Filled 2023-12-18: qty 0.5, 1d supply, fill #0

## 2023-12-24 ENCOUNTER — Ambulatory Visit (HOSPITAL_COMMUNITY)
Admission: RE | Admit: 2023-12-24 | Discharge: 2023-12-24 | Disposition: A | Discharge status: Home / Self Care | Source: Ambulatory Visit | Attending: Surgery | Admitting: Surgery

## 2023-12-24 DIAGNOSIS — I739 Peripheral vascular disease, unspecified: Secondary | ICD-10-CM | POA: Insufficient documentation

## 2023-12-24 LAB — VAS US LOWER EXTREMITY EXERCISE
Left ABI: 1.26
Right ABI: 1.19

## 2024-01-02 ENCOUNTER — Encounter: Payer: Self-pay | Admitting: Vascular Surgery

## 2024-01-02 ENCOUNTER — Ambulatory Visit: Attending: Vascular Surgery | Admitting: Vascular Surgery

## 2024-01-02 VITALS — BP 121/85 | HR 73 | Temp 98.0°F | Ht 66.0 in | Wt 162.0 lb

## 2024-01-02 DIAGNOSIS — R609 Edema, unspecified: Secondary | ICD-10-CM

## 2024-01-02 NOTE — Progress Notes (Signed)
 Patient ID: Amber Terrell, female   DOB: 02/22/1976, 48 y.o.   MRN: [id number]  Reason for Consult: Follow-up   Referred by Chrystal Lamarr RAMAN, *  Subjective:     HPI:  Amber Terrell is a 48 y.o. female had a very low-grade injury to her right leg last January while walking into the house she felt pain in her posterior right lower extremity in the calf area.  She was evaluated for DVT at that time and was negative and also negative for Baker's cyst.  She subsequently had right lower extremity discoloration and persistent swelling which is worse throughout the day.  She also has an unexplained occasional rash on her anterior right thigh.  She had been treated by dermatology for systemic rash with steroids on 3 separate occasions which led to approximately 10 pounds of weight gain but she is otherwise not overweight.  She does occasionally wear compression stocking.  She does not have any tissue loss or ulceration.  She also has reticular veins about the right medial ankle which have only been present since last January.  Past Medical History:  Diagnosis Date   Cancer (HCC)    precancerous cervical cells... pt had LEEP   No pertinent past medical history    Postpartum care following vaginal delivery (2/8) 04/06/2013   Routine postpartum follow-up 10/21/2010   Vaginal Pap smear, abnormal    Family History  Problem Relation Age of Onset   Hyperlipidemia Mother    Seizures Father    Other Father        benign brain tumor   Stroke Father    Hyperlipidemia Brother    Hyperlipidemia Maternal Uncle    Alcohol abuse Maternal Uncle    Hyperlipidemia Maternal Grandmother    Heart disease Maternal Grandfather    Past Surgical History:  Procedure Laterality Date   LEEP  01/13/2010   WISDOM TOOTH EXTRACTION      Short Social History:  Social History   Tobacco Use   Smoking status: Never   Smokeless tobacco: Not on file  Substance Use Topics   Alcohol use: Yes    Alcohol/week: 0.0  standard drinks of alcohol    Comment: once a month    Allergies  Allergen Reactions   Penicillins Rash    Current Outpatient Medications  Medication Sig Dispense Refill   Ergocalciferol  50 MCG (2000 UT) CAPS 1 capsule.     fexofenadine (ALLEGRA ALLERGY) 60 MG tablet Take 60 mg by mouth as needed for allergies.     levonorgestrel (MIRENA, 52 MG,) 20 MCG/DAY IUD by Intrauterine route.     valACYclovir  (VALTREX ) 1000 MG tablet Take 1 tablet (1,000 mg total) by mouth 2 (two) times daily for 2 days as needed for cold sore outbreaks. 30 tablet 0   No current facility-administered medications for this visit.    Review of Systems  Constitutional:  Constitutional negative. HENT: HENT negative.  Eyes: Eyes negative.  Respiratory: Respiratory negative.  Cardiovascular: Positive for leg swelling.  GI: Gastrointestinal negative.  Musculoskeletal: Positive for leg pain.  Skin: Skin negative.  Neurological: Neurological negative. Hematologic: Hematologic/lymphatic negative.  Psychiatric: Psychiatric negative.        Objective:  Objective   Vitals:   01/02/24 0859  BP: 121/85  Pulse: 73  Temp: 98 F (36.7 C)  SpO2: 98%  Weight: 162 lb (73.5 kg)  Height: 5' 6 (1.676 m)   Body mass index is 26.15 kg/m.  Physical Exam HENT:  Head: Normocephalic.     Nose: Nose normal.  Eyes:     Pupils: Pupils are equal, round, and reactive to light.  Cardiovascular:     Rate and Rhythm: Normal rate.     Pulses: Normal pulses.  Pulmonary:     Effort: Pulmonary effort is normal.  Abdominal:     General: Abdomen is flat.  Musculoskeletal:     Cervical back: Normal range of motion.     Right lower leg: Edema present.     Left lower leg: No edema.  Skin:    General: Skin is warm.     Capillary Refill: Capillary refill takes less than 2 seconds.  Neurological:     General: No focal deficit present.     Mental Status: She is alert.  Psychiatric:        Mood and Affect: Mood  normal.     Data: ABI Findings:  +--------+------------------+-----+---------+--------+  Right  Rt Pressure (mmHg)IndexWaveform Comment   +--------+------------------+-----+---------+--------+  [id number]                                      +--------+------------------+-----+---------+--------+  ATA    133               1.19 triphasic          +--------+------------------+-----+---------+--------+  PTA    133               1.19 triphasic          +--------+------------------+-----+---------+--------+   +--------+------------------+-----+---------+-------+  Left   Lt Pressure (mmHg)IndexWaveform Comment  +--------+------------------+-----+---------+-------+  [id number]                                     +--------+------------------+-----+---------+-------+  ATA    138               1.23 triphasic         +--------+------------------+-----+---------+-------+  PTA    141               1.26 triphasic         +--------+------------------+-----+---------+-------+   +-------+-----------+-----------+------------+------------+  ABI/TBIToday's ABIToday's TBIPrevious ABIPrevious TBI  +-------+-----------+-----------+------------+------------+  Right 1.19                                            +-------+-----------+-----------+------------+------------+  Left  1.26                                            +-------+-----------+-----------+------------+------------+     Exercise:   The patient walked on the treadmill for 5 minutes, at 2.5 mph and a 12  degree incline.   +----+--------+-------+-------------+  TimeLocationSymptomComments      +----+--------+-------+-------------+  2.5                No complaints  +----+--------+-------+-------------+  5                 No complaints  +----+--------+-------+-------------+   After exercise the right ABI increased, which is a normal exercise   response. After exercise the left ABI increased, which is a normal  exercise  response. Normal physiological changes after 5 minutes on the  treadmill.         Assessment/Plan:    48 year old female here with right lower extremity swelling, discoloration and pain 10 months after low-grade injury which does not appear to explain her current symptoms and she also did not have a Baker's cyst at the time and has no personal or family history of DVT.  On most recent reflux study there was a large perforator evaluated but unlikely that this is purely the source of her issues.  Given that she has had a systemic rash as well I have written a referral to rheumatology for further evaluation realizing that more than likely a rheumatologic disorder would be bilateral in nature.  I will have her follow-up in 3 more months with repeat reflux study and ultimately she may need perforator intervention.  We also discussed the possibility of having occlusive venous disease in her iliac veins although without by swelling I am unsure this is the cause of either.  She should continue activity as tolerated and wear compression stockings as needed.    Genesis Novosad C. Sheree, MD [facility] Office: [phone] Pager: [phone]

## 2024-01-03 ENCOUNTER — Other Ambulatory Visit: Payer: Self-pay | Admitting: *Deleted

## 2024-01-03 DIAGNOSIS — I872 Venous insufficiency (chronic) (peripheral): Secondary | ICD-10-CM

## 2024-01-04 DIAGNOSIS — M542 Cervicalgia: Secondary | ICD-10-CM | POA: Diagnosis not present

## 2024-04-09 ENCOUNTER — Ambulatory Visit: Admitting: Vascular Surgery

## 2024-04-09 ENCOUNTER — Ambulatory Visit (HOSPITAL_COMMUNITY)

## 2024-06-11 ENCOUNTER — Ambulatory Visit: Admitting: Vascular Surgery

## 2024-06-11 ENCOUNTER — Ambulatory Visit (HOSPITAL_COMMUNITY)
